# Patient Record
Sex: Female | Born: 1950 | Hispanic: No | Smoking: Never smoker
Health system: Southern US, Community
[De-identification: ages and names within clinical notes are randomized; demographics above are authoritative.]

## PROBLEM LIST (undated history)

## (undated) DIAGNOSIS — E785 Hyperlipidemia, unspecified: Secondary | ICD-10-CM

## (undated) DIAGNOSIS — M069 Rheumatoid arthritis, unspecified: Secondary | ICD-10-CM

## (undated) DIAGNOSIS — M810 Age-related osteoporosis without current pathological fracture: Secondary | ICD-10-CM

## (undated) DIAGNOSIS — Z9889 Other specified postprocedural states: Secondary | ICD-10-CM

## (undated) DIAGNOSIS — R112 Nausea with vomiting, unspecified: Secondary | ICD-10-CM

## (undated) HISTORY — PX: CHOLECYSTECTOMY: SHX55

## (undated) HISTORY — DX: Hyperlipidemia, unspecified: E78.5

## (undated) HISTORY — PX: BUNIONECTOMY: SHX129

## (undated) HISTORY — PX: HYSTEROTOMY: SHX1776

## (undated) HISTORY — DX: Rheumatoid arthritis, unspecified: M06.9

## (undated) HISTORY — PX: BACK SURGERY: SHX140

## (undated) HISTORY — PX: FACIAL COSMETIC SURGERY: SHX629

---

## 2015-06-22 DIAGNOSIS — M542 Cervicalgia: Secondary | ICD-10-CM | POA: Insufficient documentation

## 2015-06-22 DIAGNOSIS — M25561 Pain in right knee: Secondary | ICD-10-CM | POA: Insufficient documentation

## 2015-06-22 DIAGNOSIS — M25562 Pain in left knee: Secondary | ICD-10-CM

## 2015-06-22 DIAGNOSIS — M549 Dorsalgia, unspecified: Secondary | ICD-10-CM | POA: Insufficient documentation

## 2015-06-22 DIAGNOSIS — R768 Other specified abnormal immunological findings in serum: Secondary | ICD-10-CM | POA: Insufficient documentation

## 2015-06-24 DIAGNOSIS — R768 Other specified abnormal immunological findings in serum: Secondary | ICD-10-CM | POA: Insufficient documentation

## 2015-06-30 DIAGNOSIS — Z1239 Encounter for other screening for malignant neoplasm of breast: Secondary | ICD-10-CM | POA: Insufficient documentation

## 2015-06-30 DIAGNOSIS — Z13820 Encounter for screening for osteoporosis: Secondary | ICD-10-CM | POA: Insufficient documentation

## 2015-09-06 DIAGNOSIS — M50323 Other cervical disc degeneration at C6-C7 level: Secondary | ICD-10-CM | POA: Insufficient documentation

## 2015-09-06 DIAGNOSIS — M21612 Bunion of left foot: Secondary | ICD-10-CM | POA: Insufficient documentation

## 2015-09-13 DIAGNOSIS — M2012 Hallux valgus (acquired), left foot: Secondary | ICD-10-CM | POA: Insufficient documentation

## 2015-09-25 DIAGNOSIS — M21619 Bunion of unspecified foot: Secondary | ICD-10-CM | POA: Insufficient documentation

## 2015-09-25 DIAGNOSIS — M810 Age-related osteoporosis without current pathological fracture: Secondary | ICD-10-CM | POA: Insufficient documentation

## 2015-10-04 ENCOUNTER — Ambulatory Visit (INDEPENDENT_AMBULATORY_CARE_PROVIDER_SITE_OTHER): Payer: Medicaid Other

## 2015-10-04 ENCOUNTER — Ambulatory Visit (INDEPENDENT_AMBULATORY_CARE_PROVIDER_SITE_OTHER): Payer: Medicaid Other | Admitting: Podiatry

## 2015-10-04 VITALS — BP 103/58 | HR 65 | Resp 16 | Ht 62.0 in | Wt 128.0 lb

## 2015-10-04 DIAGNOSIS — M2010 Hallux valgus (acquired), unspecified foot: Secondary | ICD-10-CM

## 2015-10-04 DIAGNOSIS — M25569 Pain in unspecified knee: Secondary | ICD-10-CM | POA: Insufficient documentation

## 2015-10-04 DIAGNOSIS — M542 Cervicalgia: Secondary | ICD-10-CM | POA: Insufficient documentation

## 2015-10-04 NOTE — Progress Notes (Signed)
   Subjective:    Patient ID: Heather Mills, female    DOB: 01-01-1951, 65 y.o.   MRN: [id number]  HPI: She presents today with her daughter as an interpreter as she is [place] and speech very little [place]. She states that she has bilateral great toe joint pain this in worsening over the past year she states that the pain is so severe prevent her from sleeping. She states that hurts his regular shoe gear and is limiting her ability to do the things that she likes to do.    Review of Systems  All other systems reviewed and are negative.      Objective:   Physical Exam: Vital signs are stable she is alert and oriented 3 very pleasant family in no acute distress. Pulses are strongly palpable. Neurologic sensorium is intact. Deep tendon reflexes are intact. Muscle strength +5 over 5 dorsiflexion plantar flexors and inverters everters all into the musculature is intact. Orthopedic evaluation demonstrates moderate to severe loss abductovalgus deformity bilateral left greater than right with a very large protuberance. Radiographs taken today do demonstrate near complete dislocation of the first metatarsophalangeal joint left foot as opposed to the right foot. There is no hyper mobility in the first metatarsal medial cuneiform joint. This appears to be tracked down. Cutaneous evaluation demonstrates erythema and edema overlying the first metatarsophalangeal joints bilaterally. Other than this or no open lesions or wounds.        Assessment & Plan:  Severe hallux abductovalgus deformities bilateral. Left greater than right.  Plan: Discussed etiology pathology conservative versus surgical therapies. At this point we consented her today for a [facility] bunion repair with screw fixation left foot. I answered all the questions regarding these procedures to the best of my ability in layman's terms and they were interpreted by the daughter to her mother. We did discuss the possible postop complications which  may include but are not limited to postop pain bleeding swelling infection recurrence need for further surgery overcorrection under correction continued pain chronic pain loss of digit loss of limb loss of life. She signed all 3 patient of the consent form and I will follow-up with her in the near future for surgery.

## 2015-10-04 NOTE — Patient Instructions (Signed)
Pre-Operative Instructions  Congratulations, you have decided to take an important step to improving your quality of life.  You can be assured that the doctors of [facility] will be with you every step of the way.  1. Plan to be at the surgery center/hospital at least 1 (one) hour prior to your scheduled time unless otherwise directed by the surgical center/hospital staff.  You must have a responsible adult accompany you, remain during the surgery and drive you home.  Make sure you have directions to the surgical center/hospital and know how to get there on time. 2. For hospital based surgery you will need to obtain a history and physical form from your family physician within 1 month prior to the date of surgery- we will give you a form for you primary physician.  3. We make every effort to accommodate the date you request for surgery.  There are however, times where surgery dates or times have to be moved.  We will contact you as soon as possible if a change in schedule is required.   4. No Aspirin/Ibuprofen for one week before surgery.  If you are on aspirin, any non-steroidal anti-inflammatory medications (Mobic, Aleve, Ibuprofen) you should stop taking it 7 days prior to your surgery.  You make take Tylenol  For pain prior to surgery.  5. Medications- If you are taking daily heart and blood pressure medications, seizure, reflux, allergy, asthma, anxiety, pain or diabetes medications, make sure the surgery center/hospital is aware before the day of surgery so they may notify you which medications to take or avoid the day of surgery. 6. No food or drink after midnight the night before surgery unless directed otherwise by surgical center/hospital staff. 7. No alcoholic beverages 24 hours prior to surgery.  No smoking 24 hours prior to or 24 hours after surgery. 8. Wear loose pants or shorts- loose enough to fit over bandages, boots, and casts. 9. No slip on shoes, sneakers are best. 10. Bring  your boot with you to the surgery center/hospital.  Also bring crutches or a walker if your physician has prescribed it for you.  If you do not have this equipment, it will be provided for you after surgery. 11. If you have not been contracted by the surgery center/hospital by the day before your surgery, call to confirm the date and time of your surgery. 12. Leave-time from work may vary depending on the type of surgery you have.  Appropriate arrangements should be made prior to surgery with your employer. 13. Prescriptions will be provided immediately following surgery by your doctor.  Have these filled as soon as possible after surgery and take the medication as directed. 14. Remove nail polish on the operative foot. 15. Wash the night before surgery.  The night before surgery wash the foot and leg well with the antibacterial soap provided and water paying special attention to beneath the toenails and in between the toes.  Rinse thoroughly with water and dry well with a towel.  Perform this wash unless told not to do so by your physician.  Enclosed: 1 Ice pack (please put in freezer the night before surgery)   1 Hibiclens skin cleaner   Pre-op Instructions  If you have any questions regarding the instructions, do not hesitate to call our office.  [place]: [address] [place], [place] [address] [phone]  [place]: [address] [place], [place] [address] [phone]  [place]: [address]  [place], [place] [address] [phone]   Dr.   Norman Regal DPM, Dr. Matthew Wagoner DPM, Dr. M. Todd Victormanuel Mclure DPM, Dr. Titorya Stover DPM 

## 2015-10-18 DIAGNOSIS — J392 Other diseases of pharynx: Secondary | ICD-10-CM | POA: Insufficient documentation

## 2015-11-01 ENCOUNTER — Other Ambulatory Visit: Payer: Self-pay

## 2015-11-10 ENCOUNTER — Other Ambulatory Visit: Payer: Self-pay | Admitting: Podiatry

## 2015-11-10 MED ORDER — CEPHALEXIN 500 MG PO CAPS: 500.0000 mg | ORAL_CAPSULE | Freq: Three times a day (TID) | ORAL | 0 refills | Status: DC

## 2015-11-10 MED ORDER — PROMETHAZINE HCL 25 MG PO TABS: 25.0000 mg | ORAL_TABLET | Freq: Three times a day (TID) | ORAL | 0 refills | Status: DC | PRN

## 2015-11-10 MED ORDER — OXYCODONE-ACETAMINOPHEN 10-325 MG PO TABS: 1.0000 | ORAL_TABLET | Freq: Four times a day (QID) | ORAL | 0 refills | Status: DC | PRN

## 2015-11-11 ENCOUNTER — Encounter: Payer: Self-pay | Admitting: Podiatry

## 2015-11-11 DIAGNOSIS — M2012 Hallux valgus (acquired), left foot: Secondary | ICD-10-CM | POA: Diagnosis not present

## 2015-11-14 ENCOUNTER — Telehealth: Payer: Self-pay | Admitting: *Deleted

## 2015-11-14 NOTE — Telephone Encounter (Signed)
May dc percocet.  May start dilaudid 2 mg  One to two every six. If promethazine is not working then zofran.  If symptoms worsen go to ED.  She should not have an excessive amount of pain.

## 2015-11-14 NOTE — Telephone Encounter (Addendum)
Heather Mills, pt's dtr states pt has nausea and vomiting, with rash to her stomach after taking the pain medication.  I told Heather Mills that pt is to stop the pain medication percocet, begin phenergan for the nausea and Benadryl for the itching. I told her I would contact Dr. Al Corpus and call again with instruction. 11/15/2015-Left message with Dr. Geryl Rankins orders and instructions to take with food and 30 minutes prior to dilaudid. 12/02/2015-Heather Mills, pt's dtr states pt is uncomfortable in the compression hose given by Dr. Al Corpus yesterday, even had a change of color in the little toe. I told Heather Mills to not to allow pt to sleep in the compression anklet, to begin 1st in the morning before pt dangles foot over the bed, and to adjust compression for comfort is rubs area, that if during the day it becomes uncomfortable to remove and wrap from the toes up the leg with the ace wrap and try again the next day, that we are trying to train the post op swelling to remain out of the foot. Heather Mills states understanding.

## 2015-11-15 MED ORDER — HYDROMORPHONE HCL 2 MG PO TABS: ORAL_TABLET | ORAL | 0 refills | Status: DC

## 2015-11-17 ENCOUNTER — Ambulatory Visit (INDEPENDENT_AMBULATORY_CARE_PROVIDER_SITE_OTHER): Payer: Medicaid Other | Admitting: Podiatry

## 2015-11-17 ENCOUNTER — Ambulatory Visit (INDEPENDENT_AMBULATORY_CARE_PROVIDER_SITE_OTHER): Payer: Medicaid Other

## 2015-11-17 VITALS — BP 116/64 | Temp 97.1°F

## 2015-11-17 DIAGNOSIS — Z9889 Other specified postprocedural states: Secondary | ICD-10-CM

## 2015-11-17 DIAGNOSIS — M2010 Hallux valgus (acquired), unspecified foot: Secondary | ICD-10-CM | POA: Diagnosis not present

## 2015-11-17 NOTE — Progress Notes (Signed)
She presents today for her first postop visit [facility] bunion repair date of surgery 11/11/2015 left foot. She denies fever chills nausea vomiting muscle aches and pains.  Objective: Dry sterile dressing intact was removed demonstrates mild edema no erythema saline as drainage or odor. She has no pain on range of motion of the foot. Toe is rectus radiographs demonstrate good position of the capital osteotomy.  Assessment: Healing surgical foot left.  Plan: Redressed today drastic compressive dressing continue the use of the Cam Walker follow-up with [place] in 1 week.

## 2015-11-17 NOTE — Progress Notes (Signed)
DOS 10.13.2017 [facility] Bunion Repair with Screw Left Foot

## 2015-11-24 ENCOUNTER — Encounter: Payer: Self-pay | Admitting: Podiatry

## 2015-11-24 ENCOUNTER — Other Ambulatory Visit: Payer: Self-pay | Admitting: Podiatry

## 2015-11-24 ENCOUNTER — Ambulatory Visit (INDEPENDENT_AMBULATORY_CARE_PROVIDER_SITE_OTHER): Payer: Medicaid Other | Admitting: Podiatry

## 2015-11-24 DIAGNOSIS — M2010 Hallux valgus (acquired), unspecified foot: Secondary | ICD-10-CM

## 2015-11-24 DIAGNOSIS — Z9889 Other specified postprocedural states: Secondary | ICD-10-CM

## 2015-11-24 MED ORDER — TRAMADOL HCL 50 MG PO TABS: 50.0000 mg | ORAL_TABLET | Freq: Four times a day (QID) | ORAL | 0 refills | Status: DC | PRN

## 2015-11-24 NOTE — Telephone Encounter (Signed)
Request for order of Cephalexin.

## 2015-11-26 NOTE — Progress Notes (Signed)
She presents today 2 weeks status post Heather Mills bunionectomy of her left foot. She states that it has been sore recently since she's been up walking around more frequently. Date of surgery 11/11/2015.  Objective: Vital signs are stable alert and oriented 3. Pulses are palpable. Neurologic sensorium is intact. Moderate edema left. Good range of motion first metatarsophalangeal joint but tender.  Assessment: Well-healing surgical foot 2-3 weeks out.  Plan: She's going increase range of motion place her in a Darco shoe andfollow up with her in 1-2 weeks.

## 2015-12-01 ENCOUNTER — Ambulatory Visit (INDEPENDENT_AMBULATORY_CARE_PROVIDER_SITE_OTHER): Payer: Medicaid Other

## 2015-12-01 ENCOUNTER — Ambulatory Visit (INDEPENDENT_AMBULATORY_CARE_PROVIDER_SITE_OTHER): Payer: Medicaid Other | Admitting: Podiatry

## 2015-12-01 DIAGNOSIS — M2010 Hallux valgus (acquired), unspecified foot: Secondary | ICD-10-CM

## 2015-12-01 DIAGNOSIS — Z9889 Other specified postprocedural states: Secondary | ICD-10-CM

## 2015-12-01 NOTE — Progress Notes (Signed)
She presents today 3 weeks status post [facility] bunion repair left foot. She states that she is doing a lot better and feels much better than she did last week. Denies fever chills nausea vomiting muscle aches and pains. States that the left foot feels much better.  Objective: Vital signs are stable she is alert and oriented 3 mild edema. Good range of motion of the first metatarsophalangeal joint. Radiographs taken today do not demonstrate any type of major osseous abnormalities there is no been no movement of the capital fragment. Internal fixation remains intact.  Assessment: Well-healing surgical foot left.  Plan: Place her in a compression anklet today encouraged range of motion exercises will follow up with her in approximately 2 weeks which time we'll get back into regular shoe gear. X-rays will be taken at that time.

## 2015-12-09 DIAGNOSIS — E785 Hyperlipidemia, unspecified: Secondary | ICD-10-CM | POA: Insufficient documentation

## 2015-12-09 DIAGNOSIS — Z23 Encounter for immunization: Secondary | ICD-10-CM | POA: Insufficient documentation

## 2015-12-09 DIAGNOSIS — E7800 Pure hypercholesterolemia, unspecified: Secondary | ICD-10-CM | POA: Insufficient documentation

## 2015-12-13 ENCOUNTER — Ambulatory Visit (INDEPENDENT_AMBULATORY_CARE_PROVIDER_SITE_OTHER): Payer: Medicaid Other

## 2015-12-13 ENCOUNTER — Ambulatory Visit (INDEPENDENT_AMBULATORY_CARE_PROVIDER_SITE_OTHER): Payer: Medicaid Other | Admitting: Podiatry

## 2015-12-13 DIAGNOSIS — Z9889 Other specified postprocedural states: Secondary | ICD-10-CM

## 2015-12-13 DIAGNOSIS — M2010 Hallux valgus (acquired), unspecified foot: Secondary | ICD-10-CM

## 2015-12-13 NOTE — Progress Notes (Signed)
She presents today for follow-up of her surgical foot left she is status post [facility] bunion repair 11/11/2015. 1 month out and states that she cannot wear the compression anklet because it is too tight of her foot. She would also like to consider having the other foot corrected.  Objective: Vital signs are stable she is alert and oriented 3. Pulses are palpable. Left foot appears to be healing very night she has a great range of motion of the first metatarsophalangeal joint limited edema. Radiographs demonstrate mild edema first metatarsal is intact. Internal fixation is intact and in good position well-healing osteotomy.  Assessment: For follow-up with her in 3-4 weeks which time we will discuss the other foot. At this point and would allow her to get back into regular tennis shoes or the Darco shoe.

## 2015-12-15 DIAGNOSIS — M0579 Rheumatoid arthritis with rheumatoid factor of multiple sites without organ or systems involvement: Secondary | ICD-10-CM | POA: Insufficient documentation

## 2015-12-27 ENCOUNTER — Ambulatory Visit (INDEPENDENT_AMBULATORY_CARE_PROVIDER_SITE_OTHER): Payer: Medicaid Other

## 2015-12-27 ENCOUNTER — Ambulatory Visit (INDEPENDENT_AMBULATORY_CARE_PROVIDER_SITE_OTHER): Payer: Medicaid Other | Admitting: Podiatry

## 2015-12-27 DIAGNOSIS — Z9889 Other specified postprocedural states: Secondary | ICD-10-CM

## 2015-12-27 DIAGNOSIS — M2010 Hallux valgus (acquired), unspecified foot: Secondary | ICD-10-CM | POA: Diagnosis not present

## 2015-12-28 NOTE — Progress Notes (Signed)
She presents today for a 6 week postop visit regarding her [facility] bunion repair of her left foot. She states is doing much better but continues to wear her Darco shoe because of the swelling.  Objective: Vital signs are stable she is alert and oriented 3. Moderate edema limited range of motion dorsiflexion and plantarflexion radiographs demonstrate well-healing osteotomy. No fractures or are identified. I also reviewed all radiographs of the right foot which they are requesting surgical intervention upon.  Right foot does demonstrate a increase in the first metatarsal angle with a hypertrophic medial condyle.  Assessment: Well-healing surgical foot left. Some noncompliance resulting in the edema and lack of range of motion. Right foot hallux valgus deformity painful in nature.  Plan: Consented her today for [facility] bunion repair of the right foot. Encouraged to utilize the tennis shoes more frequently than the Darco shoe to help increase range of motion and get her back to walking on the left foot. She went over the consent form today line by line number by number giving her appetite as much she sought the regarding that right foot surgery. We answered all his questions to the best of my ability in layman's terms. She will follow up with [place] in the near future for surgical intervention or in 3 weeks for follow-up of the left foot.

## 2016-01-17 ENCOUNTER — Ambulatory Visit (INDEPENDENT_AMBULATORY_CARE_PROVIDER_SITE_OTHER): Payer: Medicaid Other

## 2016-01-17 ENCOUNTER — Ambulatory Visit (INDEPENDENT_AMBULATORY_CARE_PROVIDER_SITE_OTHER): Payer: Self-pay | Admitting: Podiatry

## 2016-01-17 DIAGNOSIS — M2010 Hallux valgus (acquired), unspecified foot: Secondary | ICD-10-CM | POA: Diagnosis not present

## 2016-01-17 NOTE — Progress Notes (Signed)
She presents today for follow-up of her bunion repair left foot. She states this seems to be getting better.  Objective: Vital signs are stable she is alert and oriented 3. Pulses are palpable. Neurologic sensorium is intact. She has great range of motion of the first metatarsophalangeal joint of the left foot with mild tenderness on range of motion. Radiographs confirm well-healing osteotomy.  Assessment: Hallux abductovalgus for me right foot. Healing first metatarsal osteotomy left foot.  Plan: Encouraged range of motion exercises and ambulation for the left foot. We will review the consent form once again for the right foot today confirming [facility] bunion repair and we'll follow-up with her in the near future. January 5 for the right foot.

## 2016-02-02 ENCOUNTER — Other Ambulatory Visit: Payer: Self-pay | Admitting: Podiatry

## 2016-02-02 MED ORDER — HYDROMORPHONE HCL 2 MG PO TABS: ORAL_TABLET | ORAL | 0 refills | Status: DC

## 2016-02-02 MED ORDER — PROMETHAZINE HCL 25 MG PO TABS: 25.0000 mg | ORAL_TABLET | Freq: Three times a day (TID) | ORAL | 0 refills | Status: DC | PRN

## 2016-02-03 ENCOUNTER — Encounter: Payer: Self-pay | Admitting: Podiatry

## 2016-02-03 DIAGNOSIS — M2011 Hallux valgus (acquired), right foot: Secondary | ICD-10-CM | POA: Diagnosis not present

## 2016-02-06 ENCOUNTER — Telehealth: Payer: Self-pay

## 2016-02-06 NOTE — Telephone Encounter (Signed)
LVM regarding post operative status. Call with any questions or concerns

## 2016-02-09 ENCOUNTER — Encounter: Payer: Self-pay | Admitting: Podiatry

## 2016-02-09 ENCOUNTER — Ambulatory Visit (INDEPENDENT_AMBULATORY_CARE_PROVIDER_SITE_OTHER): Payer: Medicaid Other

## 2016-02-09 ENCOUNTER — Ambulatory Visit (INDEPENDENT_AMBULATORY_CARE_PROVIDER_SITE_OTHER): Payer: Self-pay | Admitting: Podiatry

## 2016-02-09 VITALS — Temp 98.7°F

## 2016-02-09 DIAGNOSIS — M2010 Hallux valgus (acquired), unspecified foot: Secondary | ICD-10-CM

## 2016-02-09 NOTE — Progress Notes (Signed)
She presents today for a follow-up of her [facility] bunionectomy right foot. Status post 1 week now date of surgery 02/03/2016.  Objective: Vital signs are stable she is alert and oriented 3 dry sterile dressing intact demonstrates was removed no erythema cellulitis drainage or odor. Just mild edema. She has good range of motion of the first metatarsophalangeal joint.  Assessment: Well-healing surgical foot right.  Plan: Follow up with me in 1 week.

## 2016-02-17 ENCOUNTER — Ambulatory Visit (INDEPENDENT_AMBULATORY_CARE_PROVIDER_SITE_OTHER): Payer: Self-pay | Admitting: Podiatry

## 2016-02-17 ENCOUNTER — Ambulatory Visit (INDEPENDENT_AMBULATORY_CARE_PROVIDER_SITE_OTHER): Payer: Medicaid Other

## 2016-02-17 DIAGNOSIS — M2010 Hallux valgus (acquired), unspecified foot: Secondary | ICD-10-CM

## 2016-02-19 NOTE — Progress Notes (Signed)
Subjective:     Patient ID: Heather Mills, female   DOB: 10/16/1950, 66 y.o.   MRN: [id number]  HPI patient presents with caregiver stating that her right foot seems to be feeling okay and she's here for stitch removal and that she's had some discomfort on the side of her left big toe she wanted checked. Presents with caregiver   Review of Systems     Objective:   Physical Exam Neurovascular status intact negative Homan sign was noted with wound edges well coapted first MPJ right and digit. Good alignment is noted good range of motion with no crepitus and on the left foot I noted mild discomfort in the lateral side of the first MPJ where she had previous surgery several months ago    Assessment:     Doing well right foot with mild to moderate inflammatory condition left which may be related to increased activity or excessive weightbearing    Plan:     H&P conditions reviewed precautionary x-ray taken left. Stitches removed right sterile dressing reapplied and reappoint for reevaluation with continued immobilization at this time  X-ray report left was negative for signs of bone movement or movement of fixation.

## 2016-02-23 ENCOUNTER — Encounter: Payer: Self-pay | Admitting: Podiatry

## 2016-02-23 ENCOUNTER — Ambulatory Visit (INDEPENDENT_AMBULATORY_CARE_PROVIDER_SITE_OTHER): Payer: Medicaid Other

## 2016-02-23 ENCOUNTER — Ambulatory Visit (INDEPENDENT_AMBULATORY_CARE_PROVIDER_SITE_OTHER): Payer: Self-pay | Admitting: Podiatry

## 2016-02-23 DIAGNOSIS — M2010 Hallux valgus (acquired), unspecified foot: Secondary | ICD-10-CM

## 2016-02-23 DIAGNOSIS — Z9889 Other specified postprocedural states: Secondary | ICD-10-CM

## 2016-02-23 NOTE — Progress Notes (Signed)
She presents today for follow-up of her bilateral foot surgery. The first foot surgery was performed in 11/11/2015 was an [facility] bunionectomy left foot. The second one 02/03/2016 with a [facility] bunionectomy right foot. She states that the left foot has become very tender and very sore as well as pain has started to develop to the plantar lateral aspect as well. The right foot is starting to become tender and painful as well.  Objective: Vital signs are stable she is alert and oriented 3 there is no erythema edema cellulitis drainage or odor to the right foot she has good range of motion of the first metatarsophalangeal joint that she states that she has some tenderness. Left foot does demonstrate is cool to the touch the pulses are palpable. 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 At this point physical therapy is to be her best chance.  Assessment: Surgical foot bunionectomy bilateral. Postoperative pain worsening.  Plan: Send her to physical therapy for range of motion strengthening and evaluation. Follow up with her in 3-4 weeks after this if she is not better consider chronic pain facility. My also consider topical.

## 2016-02-28 ENCOUNTER — Encounter: Payer: Self-pay | Admitting: Podiatry

## 2016-02-28 ENCOUNTER — Telehealth: Payer: Self-pay | Admitting: *Deleted

## 2016-02-28 DIAGNOSIS — Z9889 Other specified postprocedural states: Secondary | ICD-10-CM

## 2016-02-28 DIAGNOSIS — R0989 Other specified symptoms and signs involving the circulatory and respiratory systems: Secondary | ICD-10-CM

## 2016-02-28 DIAGNOSIS — M2011 Hallux valgus (acquired), right foot: Secondary | ICD-10-CM

## 2016-02-28 NOTE — Telephone Encounter (Addendum)
Heather Mills states they do not take Medicaid. 02/28/2016-Referral to [facility] faxed. 03/15/2016-Left message for pt to call with new Medicaid information or fax copy of card to [phone]. 03/30/2016-Left message requesting up date of pt's Medicaid card, so I could pre-cert pt's circulation test.

## 2016-02-29 ENCOUNTER — Telehealth: Payer: Self-pay | Admitting: *Deleted

## 2016-02-29 NOTE — Telephone Encounter (Signed)
Called and left a message at 5:04 for the patient to call the office back and we would answer any questions. [name]

## 2016-03-01 ENCOUNTER — Ambulatory Visit: Payer: Medicare Other | Attending: Podiatry

## 2016-03-01 DIAGNOSIS — M25571 Pain in right ankle and joints of right foot: Secondary | ICD-10-CM | POA: Diagnosis not present

## 2016-03-01 DIAGNOSIS — M6281 Muscle weakness (generalized): Secondary | ICD-10-CM

## 2016-03-01 DIAGNOSIS — R2689 Other abnormalities of gait and mobility: Secondary | ICD-10-CM | POA: Diagnosis present

## 2016-03-01 DIAGNOSIS — M25674 Stiffness of right foot, not elsewhere classified: Secondary | ICD-10-CM | POA: Diagnosis present

## 2016-03-01 DIAGNOSIS — R600 Localized edema: Secondary | ICD-10-CM | POA: Insufficient documentation

## 2016-03-01 NOTE — Therapy (Addendum)
[facility] [address] [place], [place], [address] Phone: [phone]   Fax:  [phone]  Physical Therapy Evaluation  Patient Details  Name: Heather Mills MRN: [id number] Date of Birth: 02/25/1950 Referring Provider: Tyson Dense, MD  Encounter Date: 03/01/2016      PT End of Session - 03/01/16 1156    Visit Number 1   Date for PT Re-Evaluation 04/26/16   PT Start Time 1105   PT Stop Time 1148  no treatment due to Medicaid.     PT Time Calculation (min) 43 min   Activity Tolerance Patient tolerated treatment well   Behavior During Therapy [facility] for tasks assessed/performed      History reviewed. No pertinent past medical history.  History reviewed. No pertinent surgical history.  There were no vitals filed for this visit.       Subjective Assessment - 03/01/16 1113    Subjective Pt reports to PT s/p Rt McBride bunionectomy surgery performed 02/03/16.  Pt had Austin Bunionectomy to the Lt foot 11/11/15.  Pt is having more problems in the Lt foot.  Pt is outside the 60 day window for the Lt foot s/p surgery.  PT will evaluate and treat for Rt foot and provide treatment for Rt foot.  Pt has pain and stiffness in both feet at this time.     Patient is accompained by: Family member;Interpreter   How long can you stand comfortably? 10 minutes-Rt and Lt foot pain   How long can you walk comfortably? 10 minutes- Rt and Lt foot pain   Patient Stated Goals reduce foot pain, improve gait, improve flexibility and strength   Currently in Pain? Yes   Pain Score 7   Lt foot 8-9/10   Pain Location Foot   Pain Orientation Right;Left   Pain Descriptors / Indicators Burning;Sharp;Sore   Pain Type Surgical pain   Pain Onset 1 to 4 weeks ago   Pain Frequency Constant   Aggravating Factors  standing and walking, moving the foot, any postion   Pain Relieving Factors pain medication, sleep            [facility] PT Assessment -  03/01/16 0001      Assessment   Medical Diagnosis hallux abducto valgus s/p McBride bunionectomy Lt foot   Referring Provider Tyson Dense, MD   Onset Date/Surgical Date 02/03/16   Next MD Visit 3 weeks   Prior Therapy none     Precautions   Precautions Other (comment)  post surgical shoe on the Rt     Restrictions   Weight Bearing Restrictions No     Balance Screen   Has the patient fallen in the past 6 months No   Has the patient had a decrease in activity level because of a fear of falling?  No   Is the patient reluctant to leave their home because of a fear of falling?  No     Home Environment   Living Environment Private residence   Type of [place] Access Level entry     Prior Function   Level of Independence Independent   Vocation Unemployed   Leisure exercise, walking     Cognition   Overall Cognitive Status Within Functional Limits for tasks assessed     Observation/Other Assessments   Focus on Therapeutic Outcomes (FOTO)  69% limitation     Posture/Postural Control   Posture/Postural Control No significant limitations     ROM /  Strength   AROM / PROM / Strength AROM;PROM;Strength     AROM   Overall AROM  Deficits   Overall AROM Comments Rt great toe extension 23, flexion 12.  Lt great toe extension 20, flexion 15   AROM Assessment Site Ankle   Right/Left Ankle Right;Left   Right Ankle Dorsiflexion 20   Right Ankle Inversion --  limited by 25%   Right Ankle Eversion --  limited by 25%   Left Ankle Dorsiflexion 20   Left Ankle Inversion --  limited by 25%   Left Ankle Eversion --  Limited by 25%     PROM   Overall PROM  Deficits   Overall PROM Comments decreased Lt great toe mobility with mobs due to fixation.  Rt great toe with limited joint mobility.     Strength   Overall Strength Deficits   Overall Strength Comments Rt and Lt knee 4+/5 bil., hip flexion 4+/5   Strength Assessment Site Ankle   Right/Left Ankle Left;Right   Right  Ankle Dorsiflexion 4-/5   Right Ankle Inversion 4-/5   Right Ankle Eversion 4-/5   Left Ankle Dorsiflexion 4-/5   Left Ankle Inversion 4-/5   Left Ankle Eversion 4-/5     Palpation   Palpation comment Great toe incision on Lt 2" well healed.  Lt foot cold to touch with discoloration (mild) at the ball of the foot.  Rt great toe incision 2" healing.  Warmth and mild edema over the Rt dorsum of foot.       Transfers   Transfers Independent with all Transfers     Ambulation/Gait   Ambulation/Gait Yes   Ambulation/Gait Assistance 6: Modified independent (Device/Increase time)   Gait Pattern Step-through pattern;Decreased stance time - right;Decreased stride length  wearing post surgical shoe on the Rt                   G-codes: Mobility category CL all 3 categories          PT Short Term Goals - 03/01/16 1204      PT SHORT TERM GOAL #1   Title be independent in initial HEP   Time 4   Period Weeks   Status New           PT Long Term Goals - 03/01/16 1105      PT LONG TERM GOAL #1   Title be independent in advanced HEP   Time 8   Period Weeks   Status New     PT LONG TERM GOAL #2   Title reduce FOTO to < or = to 50% limitation   Time 8   Period Weeks   Status New     PT LONG TERM GOAL #3   Title report < or = to 3-4/10 Rt foot pain with standing   Baseline 7/10   Time 8   Period Weeks   Status New     PT LONG TERM GOAL #4   Title demonstrate 4+/5 Rt ankle and great toe strength to improve endurance and safety in the community   Baseline 4-/5   Time 8   Period Weeks   Status New     PT LONG TERM GOAL #5   Title tolerate standing for > or = to 30 minutes without limitation to improve independence in the community   Baseline 10 minutes max   Time 8   Period Weeks   Status New  Plan - 03/01/16 1157    Clinical Impression Statement Pt presents to PT s/p Rt McBride bunionectomy surgery performed on 02/03/16.  Pt also  had Austin Bunionectomy on the Lt foot on 11/11/15.  Pt with more reported problems with the Lt foot s/p surgery and this is outside the 60 day window for treatment per pt's insurance.  Pt with healing surcial incision on the Rt, limited bil. ankle strength and bil. great toe A/ROM and strength.  Pt is limited to walking 10 minutes due to pain and weakness.  Pt reports 7-9/10 Rt and Lt foot pain that is constant.  Pt with antalgic gait on level surface.  Pt is a low complexity evaluation due to lack of comorbidities that impact care.  Pt will benefit from skilled PT for Rt foot and ankle strength and flexiblity, scar mobilization, edema management and pain management to improve gait and endurance for home and community tasks.     Rehab Potential Good   PT Frequency 1x / week   PT Duration 8 weeks   PT Treatment/Interventions ADLs/Self Care Home Management;Cryotherapy;Electrical Stimulation;Functional mobility training;Stair training;Gait training;Ultrasound;Moist Heat;Therapeutic activities;Therapeutic exercise;Neuromuscular re-education;Patient/family education;Passive range of motion;Manual techniques;Vasopneumatic Device;Taping   PT Next Visit Plan Rt great toe mobility (gentle), intrinic strength, edema management, gait.  Ankle strength in non-weightbearing   Consulted and Agree with Plan of Care Patient      Patient will benefit from skilled therapeutic intervention in order to improve the following deficits and impairments:  Increased edema, Decreased strength, Decreased mobility, Decreased scar mobility, Impaired flexibility, Hypomobility, Pain, Decreased activity tolerance, Decreased endurance, Difficulty walking, Decreased range of motion  Visit Diagnosis: Pain in right ankle and joints of right foot - Plan: PT plan of care cert/re-cert  Localized edema - Plan: PT plan of care cert/re-cert  Other abnormalities of gait and mobility - Plan: PT plan of care cert/re-cert  Muscle weakness  (generalized) - Plan: PT plan of care cert/re-cert  Stiffness of right foot, not elsewhere classified - Plan: PT plan of care cert/re-cert     Problem List Patient Active Problem List   Diagnosis Date Noted  . Knee pain 10/04/2015  . Neck pain on right side 10/04/2015  . Bunion of unspecified foot 09/25/2015  . Osteoporosis 09/25/2015  . Hallux valgus of left foot 09/13/2015  . Bunion, left foot 09/06/2015  . Other cervical disc degeneration at C6-C7 level 09/06/2015  . Screening for breast cancer 06/30/2015  . Screening for osteoporosis 06/30/2015  . Positive ANA (antinuclear antibody) 06/24/2015  . Arthralgia of both knees 06/22/2015  . Elevated rheumatoid factor 06/22/2015  . Neck pain on left side 06/22/2015  . Upper back pain on left side 06/22/2015    Sigurd Sos, PT 03/01/16 12:16 PM PHYSICAL THERAPY DISCHARGE SUMMARY  Visits from Start of Care: 1  Current functional level related to goals / functional outcomes: Pt attended evaluation only and didn't return due to insurance.     Remaining deficits: See above for most current status.     Education / Equipment: HEP Plan: Patient agrees to discharge.  Patient goals were not met. Patient is being discharged due to financial reasons.  ?????         Sigurd Sos, PT 04/03/16 8:22 AM  [facility] [address], [place], [place], [address] Phone: [phone]   Fax:  [phone]  Name: Teka Chanda MRN: [id number] Date of Birth: 11/10/1950

## 2016-03-02 ENCOUNTER — Telehealth: Payer: Self-pay

## 2016-03-02 NOTE — Telephone Encounter (Signed)
Spoke with patient's daughter who states that her mom has had an increase in pain in her Rt foot DOS 11/10/15. She states that the pain has increased, and swelling has increased, along with slight discoloration at incision site. She calf pain and tenderness. Denied fever chills or nausea, no chest pain or SHOB. Advised on epsom salt soaks in warm water to help relieve pain. Also advised to start taking prescribed tramadol as directed but to stop taking meloxicam. She is to make an appt to see Dr Al Corpus next week, or call with any acute symptom changes

## 2016-03-06 ENCOUNTER — Ambulatory Visit (INDEPENDENT_AMBULATORY_CARE_PROVIDER_SITE_OTHER): Payer: Medicaid Other

## 2016-03-06 ENCOUNTER — Ambulatory Visit (INDEPENDENT_AMBULATORY_CARE_PROVIDER_SITE_OTHER): Payer: Self-pay | Admitting: Podiatry

## 2016-03-06 ENCOUNTER — Encounter: Payer: Self-pay | Admitting: Podiatry

## 2016-03-06 VITALS — BP 111/63 | HR 74 | Resp 16

## 2016-03-06 DIAGNOSIS — M2010 Hallux valgus (acquired), unspecified foot: Secondary | ICD-10-CM

## 2016-03-06 DIAGNOSIS — Z9889 Other specified postprocedural states: Secondary | ICD-10-CM

## 2016-03-06 MED ORDER — METHYLPREDNISOLONE 4 MG PO TBPK
ORAL_TABLET | ORAL | 0 refills | Status: DC
Start: 2016-03-06 — End: 2016-07-03

## 2016-03-06 MED ORDER — METHYLPREDNISOLONE 4 MG PO TABS: 4.0000 mg | ORAL_TABLET | Freq: Every day | ORAL | 0 refills | Status: DC

## 2016-03-06 NOTE — Telephone Encounter (Addendum)
-----   Message from Elinor Parkinson, [place] sent at 03/06/2016 11:43 AM EST ----- Can you please send her for vascular consult LE dopplar ABI's [facility]. Faxed referral, clinicals and demographics to [facility] Orders have been printed for ABI and TBIs, being held for pt to call with insurance information and PA with Medicaid.07/03/2016-Faxed referral, clinicals and demographics to [facility].

## 2016-03-06 NOTE — Progress Notes (Signed)
She presents today for follow-up of her surgical feet. She states that she still has pain bilaterally and she states that he really feels the same as it did last time. Date of surgery for McBride bunion ectomy 02/03/2016 for an Heather Mills was 10/12/2015. She still complaining of pain and discoloration to the forefoot and the lack of motion. Her daughter states that she has been trying to increase the range of motion but the pain is severe. She continues to take meloxicam prescribed by another physician on a regular basis. She continues to wear tissue left in a [place] on the right.  Objective: Vital signs are stable alert and oriented 3. Limited range of motion of the first metatarsophalangeal joints bilaterally. She has tenderness across the metatarsophalangeal joints and there is a definite color discrepancy between the 2 feet. Pulses are palpable at the level of the ankle and dorsalis pedis however digital changes must persist because of shiny skin that does not match the other foot. She did not rate want radiographs today though I requested that she declined.  Assessment: Heather Mills bunion repair left 11/11/2015 McBride bunion repair right 02/03/2016 with retained swelling and pain.  Plan: Started her on a Medrol Dosepak and sent for vascular evaluation. She was unable to have physical therapy because of the type of insurance she has.

## 2016-03-06 NOTE — Progress Notes (Signed)
DOS 01.05.2018 McBride bunion repair right foot with possible osteotomy.

## 2016-03-22 ENCOUNTER — Ambulatory Visit: Payer: Medicaid Other | Admitting: Podiatry

## 2016-03-22 DIAGNOSIS — M2010 Hallux valgus (acquired), unspecified foot: Secondary | ICD-10-CM

## 2016-03-22 DIAGNOSIS — M2011 Hallux valgus (acquired), right foot: Secondary | ICD-10-CM

## 2016-03-22 NOTE — Progress Notes (Unsigned)
Pt no showed for post op appt, has not gone for vascular consult since last office visit. Schedulers will contact to follow up in regards to rescheduling appt

## 2016-03-26 NOTE — Progress Notes (Deleted)
Cardiology Office Note    Date:  03/26/2016   ID:  Mills, Heather [birth year], MRN [id number]  PCP:  Deneen Harts, FNP  Cardiologist: New  Chief Complaint: Diminished pulse  History of Present Illness:   Heather Mills is a 66 y.o. female HLD and s/p Rt McBride bunionectomy surgery performed on 02/03/16 and Heather Mills on the Lt foot on 11/11/15 presents for right foot pain.    Past Medical History:  Diagnosis Date  . Dyslipidemia     No past surgical history on file.  Current Medications: Prior to Admission medications   Medication Sig Start Date End Date Taking? Authorizing Provider  atorvastatin (LIPITOR) 10 MG tablet Take 10 mg by mouth.    Historical Provider, MD  calcium acetate (PHOSLO) 667 MG capsule Take 1,334 mg by mouth.    Historical Provider, MD  cephALEXin (KEFLEX) 500 MG capsule TAKE ONE CAPSULE BY MOUTH THREE TIMES DAILY 11/24/15   Max T Hyatt, DPM  DOCOSAHEXAENOIC ACID PO Take by mouth.    Historical Provider, MD  gabapentin (NEURONTIN) 100 MG capsule Take 1 cap hs for 3 days then 2 cap hs for 1 week then 3 cap hs 07/29/15   Historical Provider, MD  gabapentin (NEURONTIN) 300 MG capsule Take 1 cap tid 10/04/15   Historical Provider, MD  Glucosamine HCl (SM GLUCOSAMINE HCL) 1500 MG TABS Take by mouth.    Historical Provider, MD  hydroxychloroquine (PLAQUENIL) 200 MG tablet Take 300 mg by mouth. 09/06/15   Historical Provider, MD  meloxicam (MOBIC) 15 MG tablet Take 15 mg by mouth. 06/30/15 06/29/16  Historical Provider, MD  methylPREDNISolone (MEDROL) 4 MG TBPK tablet Tapering 6 day dose pack 03/06/16   Max T Hyatt, DPM  Multiple Vitamin (MULTIVITAMIN) capsule Take by mouth.    Historical Provider, MD  traMADol (ULTRAM) 50 MG tablet Take 1 tablet (50 mg total) by mouth every 6 (six) hours as needed. 11/24/15   Max Maud Deed, DPM    Allergies:   Percocet [oxycodone-acetaminophen]   Social History   Social History  . Marital status: Divorced    Spouse name:  N/A  . Number of children: N/A  . Years of education: N/A   Social History Main Topics  . Smoking status: Unknown If Ever Smoked  . Smokeless tobacco: Never Used  . Alcohol use Not on file  . Drug use: Unknown  . Sexual activity: Not on file   Other Topics Concern  . Not on file   Social History Narrative  . No narrative on file     Family History:  The patient's family history includes Heart attack in her father; Heart block in her mother. She was adopted. ***  ROS:   Please see the history of present illness.    ROS All other systems reviewed and are negative.   PHYSICAL EXAM:   VS:  There were no vitals taken for this visit.   GEN: Well nourished, well developed, in no acute distress  HEENT: normal  Neck: no JVD, carotid bruits, or masses Cardiac: ***RRR; no murmurs, rubs, or gallops,no edema  Respiratory:  clear to auscultation bilaterally, normal work of breathing GI: soft, nontender, nondistended, + BS MS: no deformity or atrophy  Skin: warm and dry, no rash Neuro:  Alert and Oriented x 3, Strength and sensation are intact Psych: euthymic mood, full affect  Wt Readings from Last 3 Encounters:  10/04/15 128 lb (58.1 kg)      Studies/Labs Reviewed:  EKG:  EKG is ordered today.  The ekg ordered today demonstrates ***  Recent Labs: No results found for requested labs within last 8760 hours.   Lipid Panel No results found for: CHOL, TRIG, HDL, CHOLHDL, VLDL, LDLCALC, LDLDIRECT  Additional studies/ records that were reviewed today include:   Echocardiogram:  Cardiac Catheterization:     ASSESSMENT & PLAN:    1. ***    Medication Adjustments/Labs and Tests Ordered: Current medicines are reviewed at length with the patient today.  Concerns regarding medicines are outlined above.  Medication changes, Labs and Tests ordered today are listed in the Patient Instructions below. There are no Patient Instructions on file for this visit.    Lorelei Pont, [place]  03/26/2016 10:02 PM    [facility] [address] [place], [place], [place]  [address] Phone: [phone]; Fax: [phone]

## 2016-03-27 ENCOUNTER — Ambulatory Visit: Payer: Self-pay | Admitting: Physician Assistant

## 2016-03-29 ENCOUNTER — Encounter: Payer: Self-pay | Admitting: Physician Assistant

## 2016-07-03 ENCOUNTER — Ambulatory Visit (INDEPENDENT_AMBULATORY_CARE_PROVIDER_SITE_OTHER): Payer: Medicare Other

## 2016-07-03 ENCOUNTER — Ambulatory Visit (INDEPENDENT_AMBULATORY_CARE_PROVIDER_SITE_OTHER): Payer: Medicare Other | Admitting: Podiatry

## 2016-07-03 ENCOUNTER — Encounter: Payer: Self-pay | Admitting: Podiatry

## 2016-07-03 ENCOUNTER — Ambulatory Visit: Payer: Medicaid Other

## 2016-07-03 VITALS — BP 95/56 | HR 67 | Resp 16

## 2016-07-03 DIAGNOSIS — I739 Peripheral vascular disease, unspecified: Secondary | ICD-10-CM

## 2016-07-03 DIAGNOSIS — M2011 Hallux valgus (acquired), right foot: Secondary | ICD-10-CM | POA: Diagnosis not present

## 2016-07-03 DIAGNOSIS — M2012 Hallux valgus (acquired), left foot: Secondary | ICD-10-CM

## 2016-07-03 DIAGNOSIS — M19072 Primary osteoarthritis, left ankle and foot: Secondary | ICD-10-CM

## 2016-07-03 NOTE — Addendum Note (Signed)
Addended by: Alphia Kava D on: 07/03/2016 01:41 PM   Modules accepted: Orders

## 2016-07-03 NOTE — Progress Notes (Signed)
She presents today for a chief complaint of pain to the second metatarsophalangeal joint of the left foot and continued pain about the first metatarsophalangeal joint left foot. She has surgery 11/11/2015. She states it is still sore.  She did not have her vascular studies performed because of her insurance. She currently now has Medicaid and we'll switch over to [facility] in July.  Objective: Vital signs are stable she is alert and oriented 3 pulses remain palpable bilateral. Orthopedic evaluation does still demonstrate limited plantarflexion of the first metatarsophalangeal joint with tenderness. She has moderate to severe pain on palpation of the second metatarsophalangeal joint left. Radiographs of the left foot taken today do not demonstrate any osseous abnormalities. A single K wire and a single screw present in the first metatarsal. Joint space narrowing and subchondral sclerosis does indicate early osteoarthritic change. Mild hammertoe deformity is also noted second PIPJ.  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 Peripheral vascular disease.  Plan: Injected the second metatarsophalangeal joint today lateral to the metatarsophalangeal joint itself. Kenalog was injected. We will reorder her vascular evaluation. I will follow up with her once that comes in and we will discuss surgical intervention regarding release of the metatarsophalangeal joint removal of internal fixation and possible repair of the second toe.

## 2016-07-05 ENCOUNTER — Encounter: Payer: Self-pay | Admitting: Vascular Surgery

## 2016-07-06 ENCOUNTER — Other Ambulatory Visit: Payer: Self-pay | Admitting: *Deleted

## 2016-07-06 DIAGNOSIS — I739 Peripheral vascular disease, unspecified: Secondary | ICD-10-CM

## 2016-07-11 ENCOUNTER — Ambulatory Visit (INDEPENDENT_AMBULATORY_CARE_PROVIDER_SITE_OTHER): Payer: Medicare Other | Admitting: Vascular Surgery

## 2016-07-11 ENCOUNTER — Encounter: Payer: Self-pay | Admitting: Vascular Surgery

## 2016-07-11 ENCOUNTER — Ambulatory Visit (HOSPITAL_COMMUNITY)
Admission: RE | Admit: 2016-07-11 | Discharge: 2016-07-11 | Disposition: A | Discharge status: Home / Self Care | Payer: Medicare Other | Source: Ambulatory Visit | Attending: Vascular Surgery | Admitting: Vascular Surgery

## 2016-07-11 VITALS — BP 112/66 | HR 67 | Temp 98.7°F | Resp 16 | Ht 62.0 in | Wt 133.0 lb

## 2016-07-11 DIAGNOSIS — M25579 Pain in unspecified ankle and joints of unspecified foot: Secondary | ICD-10-CM

## 2016-07-11 DIAGNOSIS — I739 Peripheral vascular disease, unspecified: Secondary | ICD-10-CM | POA: Insufficient documentation

## 2016-07-11 NOTE — Progress Notes (Signed)
Requesting Physician: Ernestene Kiel DPM  History of Present Illness:  Patient is a 66 y.o. year old female who presents for evaluation of her arterial blood flow.  The patient currently describes a burning type pain left > right in the bunion area with edema.  She is 5 and 8 months out from B bunionectomies.  There is not rest pain.  There is no history of ulcerations on the feet.  She has no Atherosclerotic risk factors such as tobacco abuse, family history of PAD or CAD.  Past medical history includes: RA  Past Medical History:  Diagnosis Date  . Dyslipidemia     History reviewed. No pertinent surgical history.  ROS:   General:  No weight loss, Fever, chills  HEENT: No recent headaches, no nasal bleeding, no visual changes, no sore throat  Neurologic: No dizziness, blackouts, seizures. No recent symptoms of stroke or mini- stroke. No recent episodes of slurred speech, or temporary blindness.  Cardiac: No recent episodes of chest pain/pressure, no shortness of breath at rest.  No shortness of breath with exertion.  Denies history of atrial fibrillation or irregular heartbeat  Vascular: No history of rest pain in feet.  No history of claudication.  No history of non-healing ulcer, No history of DVT   Pulmonary: No home oxygen, no productive cough, no hemoptysis,  No asthma or wheezing  Musculoskeletal:  [ ]  Arthritis, [ ]  Low back pain,  [ ]  Joint pain  Hematologic:No history of hypercoagulable state.  No history of easy bleeding.  No history of anemia  Gastrointestinal: No hematochezia or melena,  No gastroesophageal reflux, no trouble swallowing  Urinary: [ ]  chronic Kidney disease, [ ]  on HD - [ ]  MWF or [ ]  TTHS, [ ]  Burning with urination, [ ]  Frequent urination, [ ]  Difficulty urinating;   Skin: No rashes  Psychological: No history of anxiety,  No history of depression  Social History Social History  Substance Use Topics  . Smoking status: Never Smoker  . Smokeless  tobacco: Never Used  . Alcohol use Not on file    Family History Family History  Problem Relation Age of Onset  . Adopted: Yes  . Heart block Mother   . Heart attack Father     Allergies  Allergies  Allergen Reactions  . Percocet [Oxycodone-Acetaminophen]     Nausea with vomiting and hives to stomach.     Current Outpatient Prescriptions  Medication Sig Dispense Refill  . calcium acetate (PHOSLO) 667 MG capsule Take 1,334 mg by mouth.    . Glucosamine HCl (SM GLUCOSAMINE HCL) 1500 MG TABS Take by mouth.    . hydroxychloroquine (PLAQUENIL) 200 MG tablet Take 300 mg by mouth.    . Multiple Vitamin (MULTIVITAMIN) capsule Take by mouth.     No current facility-administered medications for this visit.     Physical Examination  Vitals:   07/11/16 1412  BP: 112/66  Pulse: 67  Resp: 16  Temp: 98.7 F (37.1 C)  TempSrc: Oral  SpO2: 97%  Weight: 133 lb (60.3 kg)  Height: 5\' 2"  (1.575 m)    Body mass index is 24.33 kg/m.  General:  Alert and oriented, no acute distress HEENT: Normal Neck: No bruit or JVD Pulmonary: Clear to auscultation bilaterally Cardiac: Regular Rate and Rhythm without murmur Abdomen: Soft, non-tender, non-distended, no mass, no scars Skin: No rash, well heal B bunion incisions, tender to touch left 1 st and 2nd web space with tenderness to  the plantar first metatarsal head area. Extremity Pulses:  2+ radial, brachial, femoral, dorsalis pedis, posterior tibial pulses bilaterally Musculoskeletal: No deformity, mild left foot edema  Neurologic: Upper and lower extremity motor 5/5 and symmetric  DATA:  ABI's B Right triphasic/biphasic blood flow, TBI 0.87 Left triphasic flow TBI 0.90   ASSESSMENT:  No evidence of PAD, palpable pulse with normal arterial blood flow   PLAN:  The exam and ABI suggest she will do well with future surgery to her feet as needed.  She has normal arterial flow to both LE and digits.  F/U PRN.  Thomasena Edis, Meriam Chojnowski  MAUREEN PA-C [facility]  The patient was seen in conjunction with Dr. Edilia Bo   I have spoken with the patient and examined her and agree with the findings as noted above. Given that she has normal Doppler signals in both feet, and normal ABIs, she should have adequate circulation to heal any further surgery on her feet. Toe pressure on the right is 97 mmHg and toe pressure on the left is 101 mmHg. Generally a toe pressure of greater than 50, even in a diabetic patient, is adequate for healing. I'll be happy to see her back at anytime if any new vascular issues arise.  Waverly Ferrari, MD, FACS Beeper [phone] Office: [phone]

## 2016-08-14 ENCOUNTER — Ambulatory Visit (INDEPENDENT_AMBULATORY_CARE_PROVIDER_SITE_OTHER): Payer: Medicare Other | Admitting: Podiatry

## 2016-08-14 DIAGNOSIS — T847XXD Infection and inflammatory reaction due to other internal orthopedic prosthetic devices, implants and grafts, subsequent encounter: Secondary | ICD-10-CM

## 2016-08-14 DIAGNOSIS — M2450 Contracture, unspecified joint: Secondary | ICD-10-CM

## 2016-08-14 NOTE — Patient Instructions (Signed)

## 2016-08-14 NOTE — Progress Notes (Signed)
She presents today chief complaint of pain to the second metatarsophalangeal joint area and the first metatarsophalangeal joint area with a decreased range of motion of the first metatarsophalangeal joint left foot. She liked Done to Increase the Range Of Motion and Decreased Pain around the Second Toe.  Objective: Labs from the vascular evaluation and stripped relatively normal vascular flow. She has limited range of motion with pain on end range of motion of the first metatarsophalangeal joint in the second metatarsophalangeal joint of the left foot. I reviewed radiographs which do demonstrate internal fixation to the first metatarsal head. At this point I feel some of the pain she may be experiencing may be associated to the internal fixation.  Assessment: Pain limb secondary to retained internal fixation and limited range of motion of the first metatarsophalangeal joint contracted first metatarsophalangeal joint left foot.  Plan: Discussed etiology and pathology conservative or surgical therapies. At this point she signed a consent form today for removal of internal fixation release of the first metatarsophalangeal joint with aspiration and injection of the second metatarsophalangeal joint. I will follow up with her in the near future for surgical intervention.

## 2016-08-29 ENCOUNTER — Other Ambulatory Visit: Payer: Self-pay | Admitting: Podiatry

## 2016-08-29 MED ORDER — DOXYCYCLINE HYCLATE 100 MG PO TABS: 100.0000 mg | ORAL_TABLET | Freq: Two times a day (BID) | ORAL | 0 refills | Status: DC

## 2016-08-29 MED ORDER — HYDROCODONE-ACETAMINOPHEN 5-325 MG PO TABS: 1.0000 | ORAL_TABLET | Freq: Four times a day (QID) | ORAL | 0 refills | Status: DC | PRN

## 2016-08-29 MED ORDER — PROMETHAZINE HCL 25 MG PO TABS: 25.0000 mg | ORAL_TABLET | Freq: Three times a day (TID) | ORAL | 0 refills | Status: DC | PRN

## 2016-08-31 ENCOUNTER — Encounter: Payer: Self-pay | Admitting: Podiatry

## 2016-08-31 DIAGNOSIS — Z4889 Encounter for other specified surgical aftercare: Secondary | ICD-10-CM | POA: Diagnosis not present

## 2016-09-03 ENCOUNTER — Telehealth: Payer: Self-pay | Admitting: Podiatry

## 2016-09-03 NOTE — Telephone Encounter (Signed)
Yes I'm calling for Heather Mills. Dr. Al Corpus performed surgery on her on Friday. The antibiotic he prescribed, she is unable to take because she cannot keep it down. Could he please prescribe something else? You can call me back at [phone]. Thank you.

## 2016-09-03 NOTE — Telephone Encounter (Signed)
We have tried different antibiotics before.  Just have her stop the antibiotic.  There were no implants and she should be fine.

## 2016-09-03 NOTE — Telephone Encounter (Addendum)
Left message informing Heather Mills to have pt stop the antibiotic and take the other medications with light meal, and I would call again with orders. I informed [name], Dr. Al Corpus had stated stop the antibiotic. [name] states pain medication is not working as well as the last time. I asked Heather Mills if pt could take advil/ibuprofen and she stated yes, I told he she could take 2 advil OTC 4 times daily between dosing of the pain medication and that would help with the pain.

## 2016-09-06 ENCOUNTER — Ambulatory Visit (INDEPENDENT_AMBULATORY_CARE_PROVIDER_SITE_OTHER): Payer: Medicare Other

## 2016-09-06 ENCOUNTER — Encounter: Payer: Self-pay | Admitting: Podiatry

## 2016-09-06 ENCOUNTER — Ambulatory Visit (INDEPENDENT_AMBULATORY_CARE_PROVIDER_SITE_OTHER): Payer: Medicare Other | Admitting: Podiatry

## 2016-09-06 VITALS — BP 119/74 | Temp 97.6°F

## 2016-09-06 DIAGNOSIS — M2012 Hallux valgus (acquired), left foot: Secondary | ICD-10-CM

## 2016-09-06 DIAGNOSIS — Z9889 Other specified postprocedural states: Secondary | ICD-10-CM

## 2016-09-08 NOTE — Progress Notes (Signed)
She presents today status post release first metatarsophalangeal joint and removal internal fixation first metatarsophalangeal joint left foot. She states that exquisitely tenderness still sore beneath the second metatarsophalangeal joint. She states that she has not been moving the foot or the toe as she was instructed to do so.  Objective: Vital signs are stable alert and oriented 3. Pulses are palpable. Surgical site appears to be healing very nicely currently she has very good range of motion of the first metatarsophalangeal joint though it is somewhat tender.  Assessment: The majority of her pain today is at the second metatarsophalangeal joint capsulitis with mild hammertoe deformity. They did not want this addressed during surgery.  Plan: Redressed the foot today with a dry sterile compressive dressing after manipulation and range of motion her foot was very tender and she had to leave via wheelchair. I will follow up with her in 1 week

## 2016-09-13 ENCOUNTER — Ambulatory Visit (INDEPENDENT_AMBULATORY_CARE_PROVIDER_SITE_OTHER): Payer: Medicare Other | Admitting: Podiatry

## 2016-09-13 ENCOUNTER — Encounter: Payer: Self-pay | Admitting: Podiatry

## 2016-09-13 ENCOUNTER — Ambulatory Visit: Payer: Medicare Other

## 2016-09-13 VITALS — BP 116/74 | Temp 98.0°F

## 2016-09-13 DIAGNOSIS — M2450 Contracture, unspecified joint: Secondary | ICD-10-CM

## 2016-09-13 DIAGNOSIS — M2012 Hallux valgus (acquired), left foot: Secondary | ICD-10-CM

## 2016-09-13 DIAGNOSIS — Z9889 Other specified postprocedural states: Secondary | ICD-10-CM

## 2016-09-14 NOTE — Progress Notes (Signed)
Subjective: Heather Mills is a 66 y.o. is seen today in office s/p left foot ROH and joint release to the 1st MTPJ preformed on 08/31/2016 with Dr. Al Corpus. They state their pain is improved but she is still having pain. She has remained in the CAM boot. Denies any systemic complaints such as fevers, chills, nausea, vomiting. No calf pain, chest pain, shortness of breath.   Objective: General: No acute distress, AAOx3  DP/PT pulses palpable 2/4, CRT < 3 sec to all digits.  Protective sensation intact. Motor function intact.  Left foot: Incision is well coapted without any evidence of dehiscence and sutures are intact. There is still some movement across the incision. There is no surrounding erythema, ascending cellulitis, fluctuance, crepitus, malodor, drainage/purulence. There is mild edema around the surgical site. There is mild to moderate pain along the surgical site.  No other areas of tenderness to bilateral lower extremities.  No other open lesions or pre-ulcerative lesions.  No pain with calf compression, swelling, warmth, erythema.   Assessment and Plan:  Status post left foot surgery, doing well with no complications with subjective improvement in pain  -Treatment options discussed including all alternatives, risks, and complications -At today's appointment there is still some movement across incisions therefore I did leave the sutures intact today. Antibiotic 1 and was applied followed by a bandage. Keep dressing clean, dry, intact. -Continue cam boot. -Discussed first MTPJ range of motion to gradually start doing once her pain improves. -Ice/elevation -Pain medication as needed. -Monitor for any clinical signs or symptoms of infection and DVT/PE and directed to call the office immediately should any occur or go to the ER. -Follow-up in 1 week with Dr. Al Corpus or sooner if any problems arise. In the meantime, encouraged to call the office with any questions, concerns, change in symptoms.    Ovid Curd, DPM

## 2016-09-20 ENCOUNTER — Encounter: Payer: Self-pay | Admitting: Podiatry

## 2016-09-20 ENCOUNTER — Ambulatory Visit (INDEPENDENT_AMBULATORY_CARE_PROVIDER_SITE_OTHER): Payer: Medicare Other | Admitting: Podiatry

## 2016-09-20 DIAGNOSIS — M2450 Contracture, unspecified joint: Secondary | ICD-10-CM

## 2016-09-20 MED ORDER — HYDROCODONE-ACETAMINOPHEN 5-325 MG PO TABS: 1.0000 | ORAL_TABLET | Freq: Four times a day (QID) | ORAL | 0 refills | Status: DC | PRN

## 2016-09-20 NOTE — Progress Notes (Signed)
She presents today to have her sutures removed she is status post removal deep internal fixation first metatarsal of the left foot as well as release of the first metatarsophalangeal joint left foot. She states that she seems to be doing much better she states that the pain beneath the second metatarsal head is also resolved. Currently she continues to utilize her [employer].  Objective: Dry sterile dressing intact was removed demonstrates sutures are well coapted O signs of infection. No dehiscence. She has a good range of motion of the first metatarsophalangeal joint. Sutures were removed margins remain well coapted.  Assessment: Well-healing surgical foot first metatarsophalangeal joint release and removal of internal fixation.  Plan: Encouraged range of motion exercises also placed issues also wound today I will allow her to start getting this foot wet however I encouraged her to continue to wear her Darco shoe or Cam Walker for another 2 weeks then we will try to switch to a regular shoe. I did refill her pain prescription for hydrocodone which she takes 1 a day.

## 2016-09-28 NOTE — Progress Notes (Signed)
Dos 08-31-2016 Removal Fixation Deep Screw 1st left Capsulotomy, MPJ Release Joint 1st Left

## 2016-10-04 ENCOUNTER — Ambulatory Visit (INDEPENDENT_AMBULATORY_CARE_PROVIDER_SITE_OTHER): Payer: Medicare Other | Admitting: Podiatry

## 2016-10-04 DIAGNOSIS — M2450 Contracture, unspecified joint: Secondary | ICD-10-CM

## 2016-10-04 DIAGNOSIS — M2012 Hallux valgus (acquired), left foot: Secondary | ICD-10-CM

## 2016-10-04 NOTE — Progress Notes (Signed)
She presents today with her daughter stating that her surgical foot left first metatarsophalangeal joint is feeling better. She's concerned that she still has tenderness on palpation of the second toe and that she has radiating pain up the second toe.  Objective: Vital signs are stable she is alert and oriented 3 much improved range of motion of the first metatarsophalangeal joint though she does have tenderness on palpation of the PIPJ due to a rigid contracture deformity of the second toe left foot.  Assessment: Well-healing surgical foot with osteoarthritis.  Plan: Place her in toe spacers between the first and second toes and she really enjoys this. Follow up with her as needed.

## 2016-11-15 ENCOUNTER — Ambulatory Visit: Payer: Medicare Other | Admitting: Podiatry

## 2016-11-22 ENCOUNTER — Ambulatory Visit (INDEPENDENT_AMBULATORY_CARE_PROVIDER_SITE_OTHER): Payer: Medicare Other

## 2016-11-22 ENCOUNTER — Ambulatory Visit (INDEPENDENT_AMBULATORY_CARE_PROVIDER_SITE_OTHER): Payer: Medicare Other | Admitting: Podiatry

## 2016-11-22 DIAGNOSIS — M2012 Hallux valgus (acquired), left foot: Secondary | ICD-10-CM

## 2016-11-22 DIAGNOSIS — M2450 Contracture, unspecified joint: Secondary | ICD-10-CM

## 2016-11-24 NOTE — Progress Notes (Signed)
She presents today complaining of her foot still hurting across the top and the plantar surface.  Objective: Vital signs are stable she is alert and oriented 3 pulses remain palpable. She has good range of motion of the first metatarsophalangeal joint that we'll radiographic does appear to be arthritic. She has tenderness on palpation of the lesser metatarsal heads more than likely compensatory in nature also due to the fact that she has lost considerable amount of fat pad left greater than right. She also has pain on palpation to the third interdigital space of the left foot. This is consistent with neuroma as a palpable Mulder's click is obtained.  Assessment: metatarsalgia, capsulitis, neuroma.  Plan: Offered her injection she declined follow-up with me in 6-8 weeks.

## 2016-12-25 ENCOUNTER — Encounter: Payer: Self-pay | Admitting: Podiatry

## 2016-12-25 ENCOUNTER — Ambulatory Visit (INDEPENDENT_AMBULATORY_CARE_PROVIDER_SITE_OTHER): Payer: Medicare Other | Admitting: Podiatry

## 2016-12-25 DIAGNOSIS — G5792 Unspecified mononeuropathy of left lower limb: Secondary | ICD-10-CM | POA: Diagnosis not present

## 2016-12-25 DIAGNOSIS — L905 Scar conditions and fibrosis of skin: Secondary | ICD-10-CM | POA: Diagnosis not present

## 2016-12-25 DIAGNOSIS — R52 Pain, unspecified: Secondary | ICD-10-CM | POA: Diagnosis not present

## 2016-12-25 NOTE — Progress Notes (Signed)
She presents today for follow-up of pain to her left foot. She states she still has pain right in here she points between the first and second metatarsals.  Objective: She still has pain on deep palpation of the first intermetatarsal space. This appears to be scar tissue with pain about mid diaphyseal region on deep palpation. Minimal edema no erythema cellulitis drainage or odor inflammation beneath the second metatarsophalangeal joint has resolved and is no longer tender on range of motion and on palpation. She still has tenderness on palpation of the toe along the medial aspect of the second digit and distally.  Assessment: Scar tissue well-healing surgical foot left. 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  Plan: I injected the first interdigital space and intermetatarsal space today after sterile Betadine skin prep was achieved. She was given ethyl chloride preparation for the injection and tolerated the injection well. I injected the 20 mg of Kenalog into the scar tissue which was easily identifiable in the first intermetatarsal space. I will follow-up with her in a couple weeks at which time I hope that she this has broken down.

## 2017-02-05 ENCOUNTER — Ambulatory Visit (INDEPENDENT_AMBULATORY_CARE_PROVIDER_SITE_OTHER): Payer: Medicare Other | Admitting: Podiatry

## 2017-02-05 ENCOUNTER — Encounter: Payer: Self-pay | Admitting: Podiatry

## 2017-02-05 ENCOUNTER — Ambulatory Visit: Payer: Medicare Other | Admitting: Podiatry

## 2017-02-05 DIAGNOSIS — L905 Scar conditions and fibrosis of skin: Secondary | ICD-10-CM

## 2017-02-05 DIAGNOSIS — G5792 Unspecified mononeuropathy of left lower limb: Secondary | ICD-10-CM | POA: Diagnosis not present

## 2017-02-05 DIAGNOSIS — R52 Pain, unspecified: Secondary | ICD-10-CM

## 2017-02-05 NOTE — Progress Notes (Signed)
She presents today for follow-up of her painful first intermetatarsal space.  She states that it may be some better but is still painful and numb with burning between her toes.  Objective: Neuritis first intermetatarsal space left foot is still tender on palpation.  Pulses remain palpable.  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  Plan: I injected the area today with her first dose of dehydrated alcohol I will follow-up with her in 3 weeks for another injection.

## 2017-02-19 ENCOUNTER — Ambulatory Visit: Payer: Medicare Other | Admitting: Podiatry

## 2017-02-26 ENCOUNTER — Ambulatory Visit: Payer: Medicare Other | Admitting: Podiatry

## 2017-03-05 ENCOUNTER — Encounter: Payer: Self-pay | Admitting: Podiatry

## 2017-03-05 ENCOUNTER — Ambulatory Visit (INDEPENDENT_AMBULATORY_CARE_PROVIDER_SITE_OTHER): Payer: Medicare Other | Admitting: Podiatry

## 2017-03-05 DIAGNOSIS — R52 Pain, unspecified: Secondary | ICD-10-CM | POA: Diagnosis not present

## 2017-03-05 DIAGNOSIS — L905 Scar conditions and fibrosis of skin: Secondary | ICD-10-CM

## 2017-03-05 DIAGNOSIS — G5792 Unspecified mononeuropathy of left lower limb: Secondary | ICD-10-CM

## 2017-03-05 MED ORDER — GABAPENTIN 100 MG PO CAPS: 100.0000 mg | ORAL_CAPSULE | Freq: Every day | ORAL | 2 refills | Status: DC

## 2017-03-05 NOTE — Progress Notes (Signed)
She presents today with her daughter stating that she still has pain to the first metatarsophalangeal joint stating that is really not a lot of change.  Daughter goes on to say that her pain actually worsened and is more painful across the foot now.  Objective: No change in neurovascular status still has pain on palpation between the metatarsal phalangeal joints.  There is no erythema edema cellulitis drainage or odor.  Assessment: Continue neuritis.  Plan: I wanted to give her another injection but her daughter recommended that she not have another injection because she is afraid he was going to continue to make everything worse.  The only other alternative I explained to her was oral medications.  At this point I started her on gabapentin 100 mg at nighttime.  I want to see if this will alleviate her nocturnal symptoms.  If so then we can have one today.  I feel that it is going to be necessary to continue injection therapy she did not want to.  At this point I provided her with metatarsal silicone pads but she refused to take them.  Her daughter would like for her to consider an MRI if she has not improved next visit.

## 2017-04-09 ENCOUNTER — Ambulatory Visit: Payer: Medicare Other | Admitting: Podiatry

## 2018-04-02 DIAGNOSIS — F4323 Adjustment disorder with mixed anxiety and depressed mood: Secondary | ICD-10-CM | POA: Insufficient documentation

## 2019-02-19 ENCOUNTER — Ambulatory Visit: Payer: Medicare Other | Attending: Internal Medicine

## 2019-02-19 DIAGNOSIS — Z23 Encounter for immunization: Secondary | ICD-10-CM | POA: Insufficient documentation

## 2019-02-19 NOTE — Progress Notes (Signed)
   Covid-19 Vaccination Clinic  Name:  Heather Mills    MRN: [id number] DOB: [birth year]  02/19/2019  [name] was observed post Covid-19 immunization for 15 minutes without incidence. She was provided with Vaccine Information Sheet and instruction to access the V-Safe system.   [name] was instructed to call 911 with any severe reactions post vaccine: Marland Kitchen Difficulty breathing  . Swelling of your face and throat  . A fast heartbeat  . A bad rash all over your body  . Dizziness and weakness    Immunizations Administered    Name Date Dose VIS Date Route   Pfizer COVID-19 Vaccine 02/19/2019  6:56 PM 0.3 mL 01/09/2019 Intramuscular   Manufacturer: [employer], [employer]   Lot: [id number]   NDC: [address]-2

## 2019-03-12 ENCOUNTER — Ambulatory Visit: Payer: Medicare Other | Attending: Internal Medicine

## 2019-03-12 DIAGNOSIS — Z23 Encounter for immunization: Secondary | ICD-10-CM | POA: Insufficient documentation

## 2019-03-12 NOTE — Progress Notes (Signed)
   Covid-19 Vaccination Clinic  Name:  Heather Mills    MRN: [id number] DOB: 18-Feb-1950  03/12/2019  [name] was observed post Covid-19 immunization for 15 minutes without incidence. She was provided with Vaccine Information Sheet and instruction to access the V-Safe system.   [name] was instructed to call 911 with any severe reactions post vaccine: Marland Kitchen Difficulty breathing  . Swelling of your face and throat  . A fast heartbeat  . A bad rash all over your body  . Dizziness and weakness    Immunizations Administered    Name Date Dose VIS Date Route   Pfizer COVID-19 Vaccine 03/12/2019 12:55 PM 0.3 mL 01/09/2019 Intramuscular   Manufacturer: [employer], [employer]   Lot: [id number]   NDC: [address]-0

## 2019-06-17 ENCOUNTER — Telehealth: Payer: Self-pay | Admitting: *Deleted

## 2019-06-17 NOTE — Telephone Encounter (Signed)
Patient called and left a message to call back and I was returned the call and left a message for the patient. [name]

## 2019-11-10 ENCOUNTER — Ambulatory Visit: Payer: Medicare Other | Admitting: Podiatry

## 2019-11-10 ENCOUNTER — Ambulatory Visit (INDEPENDENT_AMBULATORY_CARE_PROVIDER_SITE_OTHER): Payer: Medicare Other | Admitting: Podiatry

## 2019-11-10 ENCOUNTER — Other Ambulatory Visit: Payer: Self-pay

## 2019-11-10 ENCOUNTER — Encounter: Payer: Self-pay | Admitting: Podiatry

## 2019-11-10 DIAGNOSIS — M7742 Metatarsalgia, left foot: Secondary | ICD-10-CM

## 2019-11-10 DIAGNOSIS — S99922A Unspecified injury of left foot, initial encounter: Secondary | ICD-10-CM | POA: Diagnosis not present

## 2019-11-10 DIAGNOSIS — M778 Other enthesopathies, not elsewhere classified: Secondary | ICD-10-CM | POA: Diagnosis not present

## 2019-11-10 NOTE — Addendum Note (Signed)
Addended by: Lottie Rater E on: 11/10/2019 12:35 PM   Modules accepted: Orders

## 2019-11-10 NOTE — Progress Notes (Signed)
She presents today with her daughter for follow-up of pain to the surgical foot left.  She states they have not been back since 19 secondary to Covid.  She states that the foot is the same if not worse still experiencing severe tenderness beneath the metatarsophalangeal joints and beneath the first metatarsophalangeal joint where a bunionectomy was performed.  She is limited ability to ambulate for long periods of time.  History of rheumatological issues daughter does not know the name of the arthritis but she does take Plaquenil for it.  She does relate a history of back disease with a history of surgery left side as well.  She still has pain down the front of her leg into her knee in the front of her thigh.  Objective: Vital signs are stable she is alert and oriented x3.  Pulses are palpable.  Physical exam is essentially unchanged with the majority of her symptoms beneath the lesser metatarsophalangeal joints.  She still has pain on end range of motion of the metatarsophalangeal joints and there is lateral deviation of the toes on the foot and limited range of motion of the first metatarsophalangeal joint.  She has pain on palpation to the forefoot in general.  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  Plan: Currently requesting an MRI of the forefoot left.  This is for surgical consideration.

## 2019-12-02 ENCOUNTER — Other Ambulatory Visit: Payer: Self-pay

## 2019-12-02 ENCOUNTER — Ambulatory Visit
Admission: RE | Admit: 2019-12-02 | Discharge: 2019-12-02 | Disposition: A | Discharge status: Home / Self Care | Payer: Medicare Other | Source: Ambulatory Visit | Attending: Podiatry | Admitting: Podiatry

## 2019-12-02 DIAGNOSIS — M778 Other enthesopathies, not elsewhere classified: Secondary | ICD-10-CM

## 2019-12-02 DIAGNOSIS — M7742 Metatarsalgia, left foot: Secondary | ICD-10-CM

## 2019-12-02 DIAGNOSIS — S99922A Unspecified injury of left foot, initial encounter: Secondary | ICD-10-CM

## 2019-12-02 IMAGING — MR MR FOOT*L* W/O CM
4 of 5 series · 18 of 40 positions shown · non-contrast
Comparison: None.

CLINICAL DATA: Foot trauma, foot pain along the plantar aspect.

EXAM:
MRI OF THE LEFT FOOT WITHOUT CONTRAST
TECHNIQUE: Multiplanar, multisequence MR imaging of the left forefoot was
performed. No intravenous contrast was administered.

[Series 4: T1 · coronal · 3.0mm · 0.19mm/px · 3 of 46 slices shown (1 of 2)]
[im 9/46]
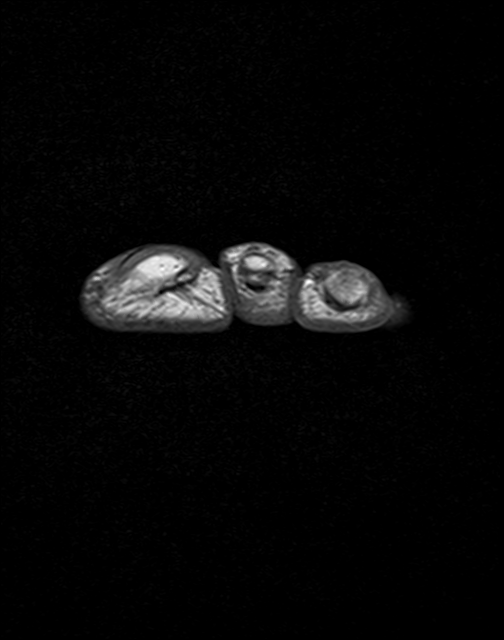
[im 25/46]
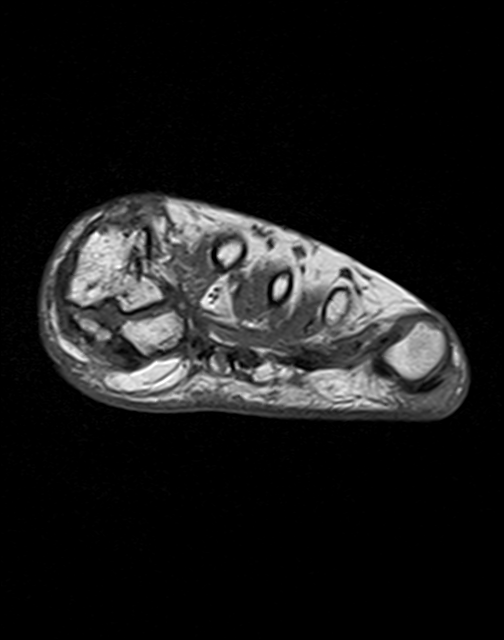
[im 41/46]
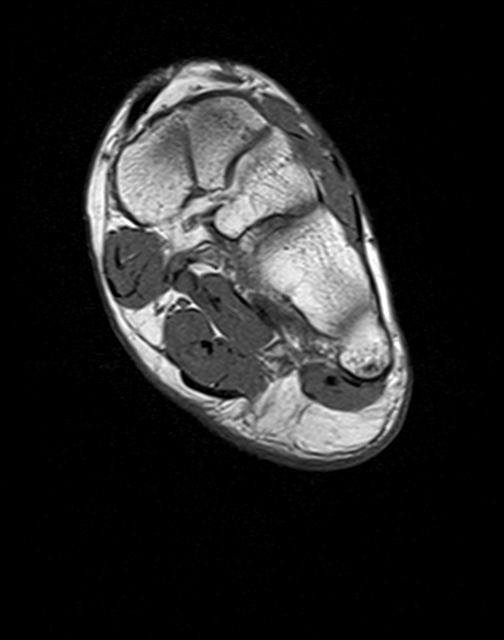

[Series 5: T2 fat-sat · coronal · 3.0mm · 0.19mm/px · 9 of 46 slices shown (1 of 2)]
[im 1/46]
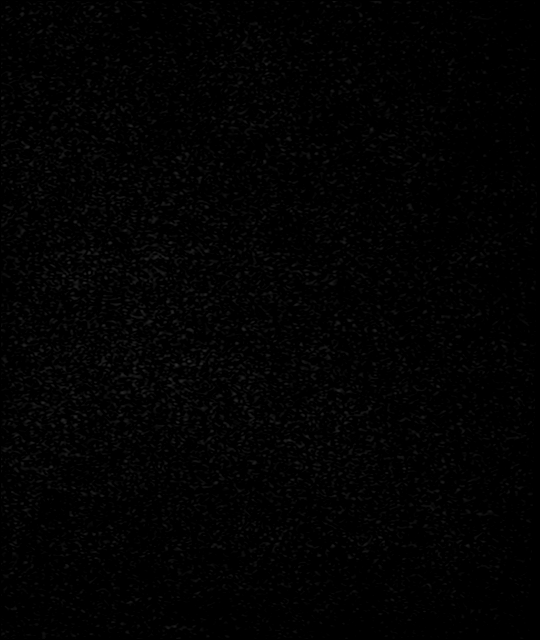
[im 5/46]
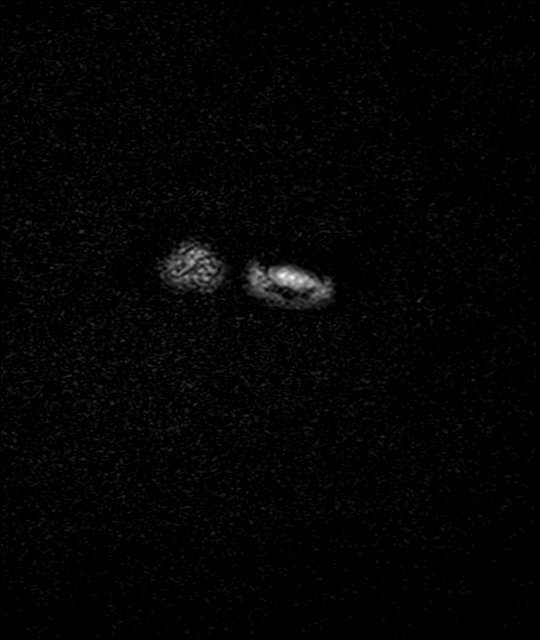
[im 10/46]
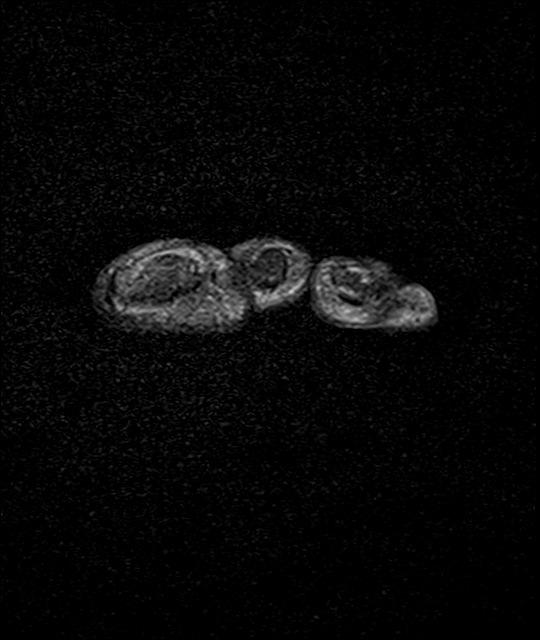
[im 14/46]
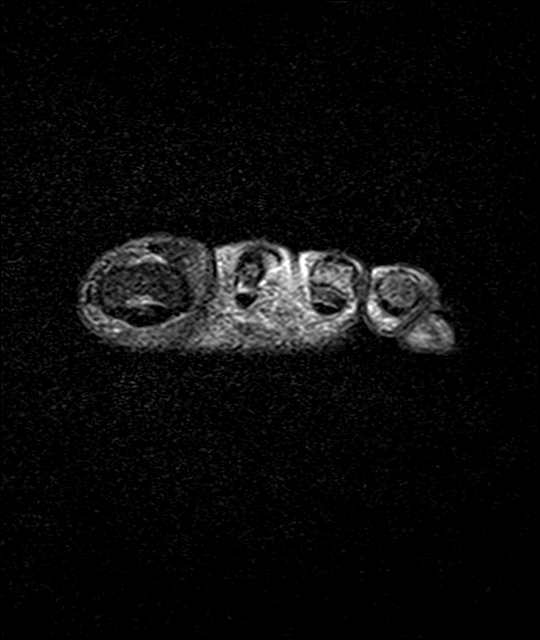
[im 19/46]
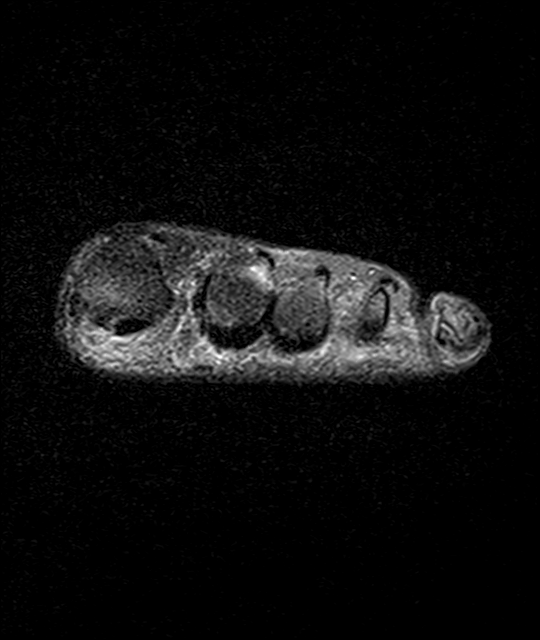
[im 23/46]
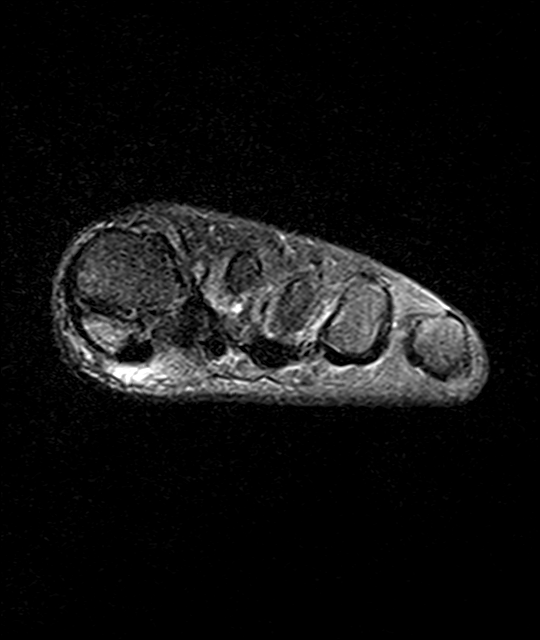
[im 28/46]
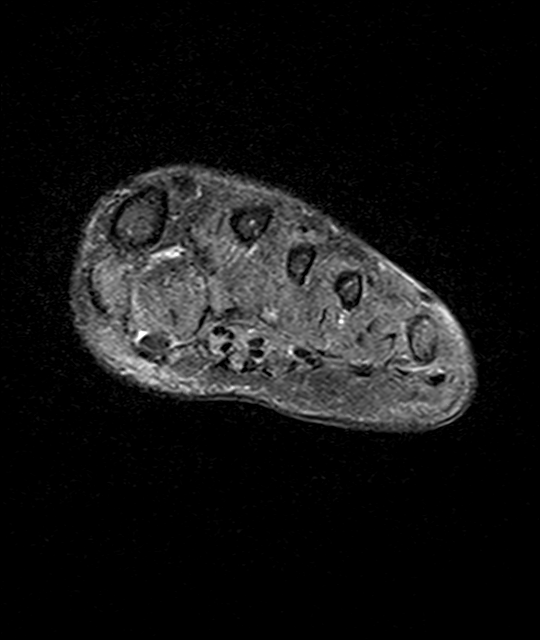
[im 32/46]
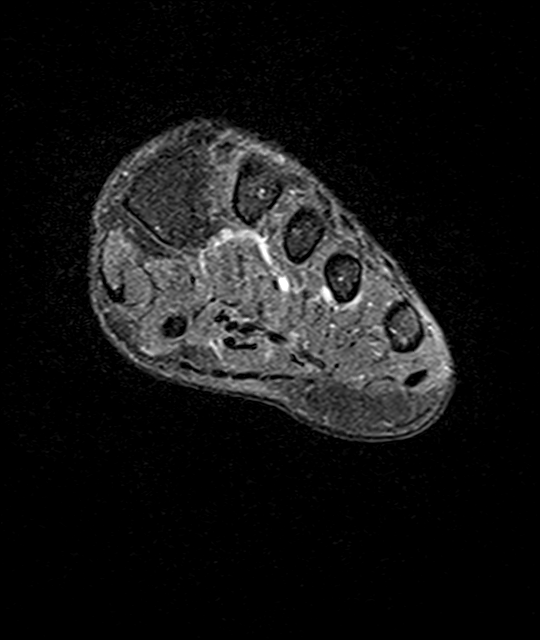
[im 41/46]
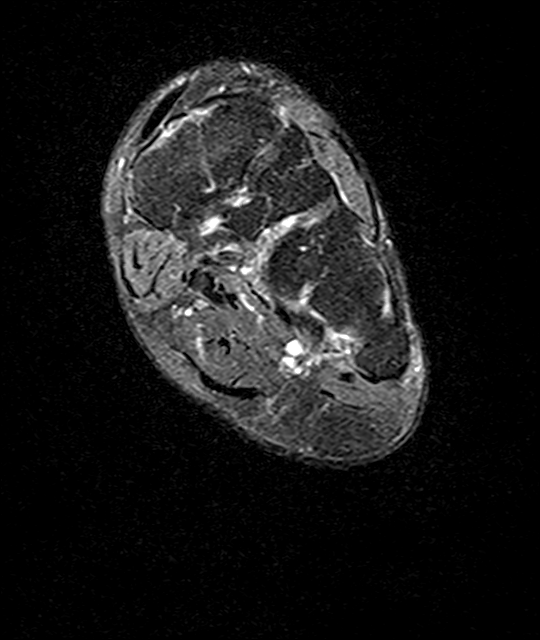

[Series 6: T2 fat-sat · axial · 3.0mm · 0.35mm/px · z∈[-148,-73]mm · 3 of 22 slices shown (2 of 2)]
[im 1/22]
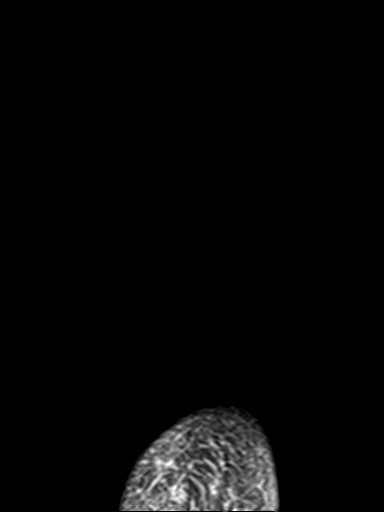
[im 11/22]
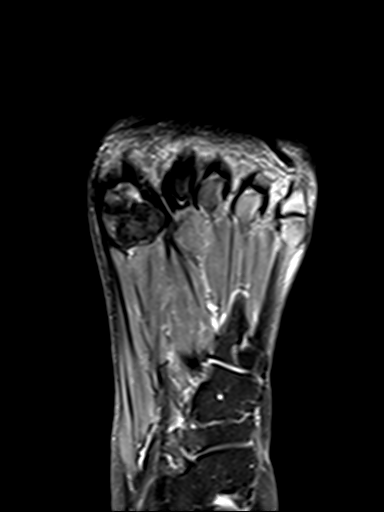
[im 22/22]
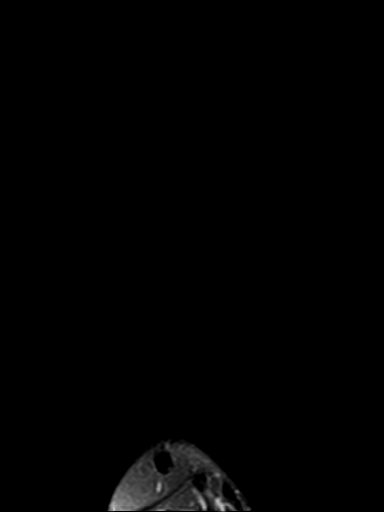

[Series 7: T1 · axial · 3.0mm · 0.35mm/px · z∈[-148,-73]mm · 3 of 22 slices shown (2 of 2)]
[im 1/22]
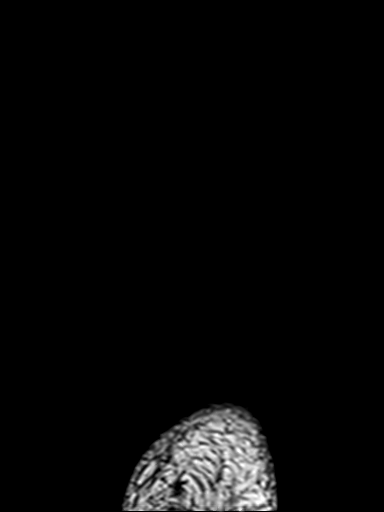
[im 11/22]
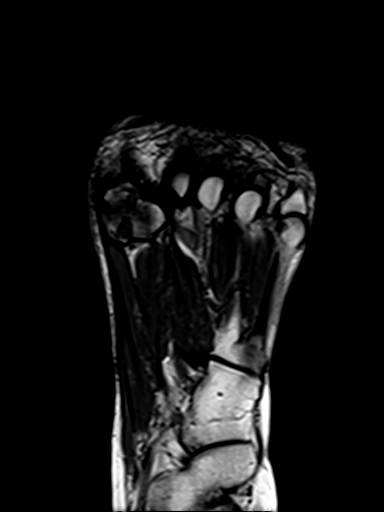
[im 22/22]
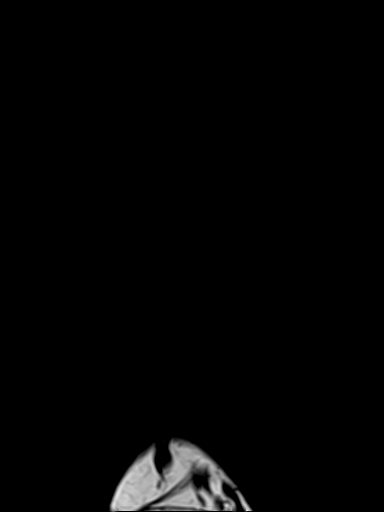

[18 of 40 positions shown; findings below may reference images not displayed]

FINDINGS: Bones/Joint/Cartilage

Severe bone marrow edema in the medial hallux sesamoid with
peripheral hypertrophic changes likely reflecting an osseous
contusion versus more likely reactive marrow edema secondary to
arthritis at the medial hallux sesamoid-metatarsal head
articulation.

No acute fracture or dislocation. Normal alignment. No joint
effusion.

Moderate osteoarthritis of the first MTP joint.

Ligaments

Collateral ligaments are intact. Lisfranc ligament is intact.
Plantar plate is intact.

Muscles and Tendons
Mild tendinosis of the flexor hallucis longus tendon at the level of
the first metatarsal neck. Muscles are normal.

Soft tissue
No fluid collection or hematoma.  No soft tissue mass.
IMPRESSION: 1. Severe bone marrow edema in the medial hallux sesamoid with
peripheral hypertrophic changes likely reflecting an osseous
contusion versus more likely reactive marrow edema secondary to
arthritis at the medial hallux sesamoid-metatarsal head
articulation.
2. Mild tendinosis of the flexor hallucis longus tendon at the level
of the first metatarsal neck.
3. Moderate osteoarthritis of the first MTP joint.

## 2019-12-31 ENCOUNTER — Other Ambulatory Visit: Payer: Self-pay

## 2019-12-31 ENCOUNTER — Ambulatory Visit: Payer: Medicare Other | Admitting: Podiatry

## 2019-12-31 ENCOUNTER — Ambulatory Visit (INDEPENDENT_AMBULATORY_CARE_PROVIDER_SITE_OTHER): Payer: Medicare Other | Admitting: Podiatry

## 2019-12-31 ENCOUNTER — Ambulatory Visit (INDEPENDENT_AMBULATORY_CARE_PROVIDER_SITE_OTHER): Payer: Medicare Other

## 2019-12-31 DIAGNOSIS — M778 Other enthesopathies, not elsewhere classified: Secondary | ICD-10-CM | POA: Diagnosis not present

## 2019-12-31 DIAGNOSIS — M25872 Other specified joint disorders, left ankle and foot: Secondary | ICD-10-CM | POA: Diagnosis not present

## 2019-12-31 DIAGNOSIS — M19272 Secondary osteoarthritis, left ankle and foot: Secondary | ICD-10-CM | POA: Diagnosis not present

## 2019-12-31 NOTE — Patient Instructions (Signed)
Wear the boot when up and walking

## 2020-01-03 NOTE — Progress Notes (Signed)
Subjective:  Patient ID: Heather Mills, female    DOB: Apr 27, 1950,  MRN: [id number]  Chief Complaint  Patient presents with  . Foot Pain    MRI results and surgery consult    69 y.o. female presents with the above complaint. History confirmed with patient.  I am seeing her as a consultation from Dr. Al Corpus.  She recently completed an MRI.  She has a chronic left foot pain that is worsening since she had bunion surgery in 2019.  She is here with her daughter today who provides translation.  She states after bunion surgery the screw was removed and the pain did not get better.  She relates the pain about the left 1st metatarsophalangeal joint.  She also had bunion surgery on the right foot but this does not hurt her.  Objective:  Physical Exam: warm, good capillary refill, no trophic changes or ulcerative lesions, normal DP and PT pulses and normal sensory exam.  Bilaterally she has well-healed surgical scars over the 1st metatarsophalangeal joint.  She has pain with end range of motion of the left 1st metatarsophalangeal joint.  She also has pain on the sesamoid complex.  Study Result  Narrative & Impression  CLINICAL DATA:  Foot trauma, foot pain along the plantar aspect.  EXAM: MRI OF THE LEFT FOOT WITHOUT CONTRAST  TECHNIQUE: Multiplanar, multisequence MR imaging of the left forefoot was performed. No intravenous contrast was administered.  COMPARISON:  None.  FINDINGS: Bones/Joint/Cartilage  Severe bone marrow edema in the medial hallux sesamoid with peripheral hypertrophic changes likely reflecting an osseous contusion versus more likely reactive marrow edema secondary to arthritis at the medial hallux sesamoid-metatarsal head articulation.  No acute fracture or dislocation. Normal alignment. No joint effusion.  Moderate osteoarthritis of the first MTP joint.  Ligaments  Collateral ligaments are intact. Lisfranc ligament is intact. Plantar plate is  intact.  Muscles and Tendons Mild tendinosis of the flexor hallucis longus tendon at the level of the first metatarsal neck. Muscles are normal.  Soft tissue No fluid collection or hematoma.  No soft tissue mass.  IMPRESSION: 1. Severe bone marrow edema in the medial hallux sesamoid with peripheral hypertrophic changes likely reflecting an osseous contusion versus more likely reactive marrow edema secondary to arthritis at the medial hallux sesamoid-metatarsal head articulation. 2. Mild tendinosis of the flexor hallucis longus tendon at the level of the first metatarsal neck. 3. Moderate osteoarthritis of the first MTP joint.   Electronically Signed   By: Elige Ko   On: 12/02/2019 14:18    Assessment:   1. Capsulitis of foot, left   2. Other secondary osteoarthritis of left foot   3. Sesamoiditis of left foot      Plan:  Patient was evaluated and treated and all questions answered.  I saw and evaluate the patient and discussed her current condition and treatment options with her daughter and the patient in detail.  I trigger addressed all their questions.  Discussed with him that the majority of her pain seems to be consistent with osteoarthritis of both the 1st MTPJ as well as the sesamoid complex.  Additionally she seems to have clinical and radiographic evidence of sesamoiditis of the tibial sesamoid.  Her daughter says that she is sure that there is some sort of nerve damage happening but I don't have any clinical subjective or objective findings of this.  I discussed with her we could order her nerve conduction studies and electromyography bilateral and how helpful this will be  at this point.   I recommend we provide her some support immobilization in a short top CAM boot and if she is not improved in 1 month to consider injection of the 1st metatarsophalangeal joint.  We discussed that if that the sesamoiditis does not improve that excision of the sesamoid could  provide some relief but there are obviously risks and benefits to this including the risk of hallux valgus recurrence.  She may return to see me or Dr. Al Corpus if they prefer

## 2020-03-25 ENCOUNTER — Telehealth: Payer: Self-pay | Admitting: Podiatry

## 2020-03-25 NOTE — Telephone Encounter (Signed)
Patients daughter calling to know if patient has received cortisone injection within the last 3 years, and if she should come in for another one. Please advise.

## 2020-03-28 ENCOUNTER — Ambulatory Visit: Payer: Medicare Other | Admitting: Podiatry

## 2020-03-28 NOTE — Telephone Encounter (Signed)
She may have another cortisone injection. You may put her with Dr. Lilian Kapur. I think they have lost confidence with me to some degree.

## 2020-03-28 NOTE — Telephone Encounter (Signed)
Schedule patient for cortisone injection with Dr. Lilian Kapur... per Dr. Al Corpus

## 2020-03-29 NOTE — Telephone Encounter (Signed)
Okay; thanks.

## 2020-03-29 NOTE — Telephone Encounter (Signed)
Pt had an appointment and cx, I called patient to follow back up to schedule and she is not sure if she wants too just yet

## 2020-03-31 ENCOUNTER — Ambulatory Visit (INDEPENDENT_AMBULATORY_CARE_PROVIDER_SITE_OTHER): Payer: Medicare Other | Admitting: Podiatry

## 2020-03-31 ENCOUNTER — Other Ambulatory Visit: Payer: Self-pay

## 2020-03-31 ENCOUNTER — Encounter: Payer: Self-pay | Admitting: Podiatry

## 2020-03-31 DIAGNOSIS — M778 Other enthesopathies, not elsewhere classified: Secondary | ICD-10-CM | POA: Diagnosis not present

## 2020-03-31 DIAGNOSIS — M25872 Other specified joint disorders, left ankle and foot: Secondary | ICD-10-CM | POA: Diagnosis not present

## 2020-03-31 DIAGNOSIS — K219 Gastro-esophageal reflux disease without esophagitis: Secondary | ICD-10-CM | POA: Insufficient documentation

## 2020-03-31 MED ORDER — TRIAMCINOLONE ACETONIDE 40 MG/ML IJ SUSP
40.0000 mg | Freq: Once | INTRAMUSCULAR | Status: AC
  Administered 2020-03-31: 40 mg

## 2020-03-31 NOTE — Progress Notes (Signed)
She presents today with her daughter for follow-up of her capsulitis and sesamoiditis of the first metatarsophalangeal joint of the left foot.  States that she would like to have an injection.  Objective: Vital signs are stable alert oriented x3 still has limited range of motion of the first metatarsophalangeal joint with pain on palpation of the tibial sesamoid.  She also has pain on palpation to the second interdigital space of the left foot with no radiating pains.  Assessment: Sesamoiditis tibial with osteoarthritis.  Neuroma or capsulitis second interdigital space left foot.  Plan: Injected these areas today both with 10 mg of Kenalog 5 mg Marcaine point of maximal tenderness.  Tolerated procedure well she will follow-up with [place] on an as-needed basis.

## 2020-09-05 ENCOUNTER — Emergency Department (HOSPITAL_COMMUNITY)
Admission: EM | Admit: 2020-09-05 | Discharge: 2020-09-06 | Disposition: A | Discharge status: Home / Self Care | Payer: Medicare Other | Attending: Emergency Medicine | Admitting: Emergency Medicine

## 2020-09-05 ENCOUNTER — Emergency Department (HOSPITAL_COMMUNITY): Payer: Medicare Other

## 2020-09-05 DIAGNOSIS — R55 Syncope and collapse: Secondary | ICD-10-CM | POA: Diagnosis present

## 2020-09-05 DIAGNOSIS — R079 Chest pain, unspecified: Secondary | ICD-10-CM | POA: Insufficient documentation

## 2020-09-05 DIAGNOSIS — M542 Cervicalgia: Secondary | ICD-10-CM | POA: Diagnosis not present

## 2020-09-05 DIAGNOSIS — R5383 Other fatigue: Secondary | ICD-10-CM | POA: Insufficient documentation

## 2020-09-05 DIAGNOSIS — R519 Headache, unspecified: Secondary | ICD-10-CM | POA: Insufficient documentation

## 2020-09-05 DIAGNOSIS — R0789 Other chest pain: Secondary | ICD-10-CM

## 2020-09-05 DIAGNOSIS — Z79899 Other long term (current) drug therapy: Secondary | ICD-10-CM | POA: Insufficient documentation

## 2020-09-05 LAB — BASIC METABOLIC PANEL
Anion gap: 9 (ref 5–15)
BUN: 15 mg/dL (ref 8–23)
CO2: 25 mmol/L (ref 22–32)
Calcium: 9.3 mg/dL (ref 8.9–10.3)
Chloride: 105 mmol/L (ref 98–111)
Creatinine, Ser: 0.8 mg/dL (ref 0.44–1.00)
GFR, Estimated: >60 mL/min (ref >60)
Glucose, Bld: 95 mg/dL (ref 70–99)
Potassium: 3.9 mmol/L (ref 3.5–5.1)
Sodium: 139 mmol/L (ref 135–145)

## 2020-09-05 LAB — CBC
HCT: 42.2 % (ref 36.0–46.0)
Hemoglobin: 13 g/dL (ref 12.0–15.0)
MCH: 31.6 pg (ref 26.0–34.0)
MCHC: 30.8 g/dL (ref 30.0–36.0)
MCV: 102.4 fL — ABNORMAL HIGH (ref 80.0–100.0)
Platelets: 224 10*3/uL (ref 150–400)
RBC: 4.12 MIL/uL (ref 3.87–5.11)
RDW: 13.5 % (ref 11.5–15.5)
WBC: 11.4 10*3/uL — ABNORMAL HIGH (ref 4.0–10.5)
nRBC: 0 % (ref 0.0–0.2)

## 2020-09-05 LAB — TROPONIN I (HIGH SENSITIVITY)
Troponin I (High Sensitivity): 3 ng/L (ref <18)
Troponin I (High Sensitivity): 4 ng/L (ref <18)

## 2020-09-05 LAB — MAGNESIUM: Magnesium: 2.2 mg/dL (ref 1.7–2.4)

## 2020-09-05 IMAGING — DX DG CHEST 2V
2 series · 2 of 2 positions shown · non-contrast
Comparison: None.

CLINICAL DATA: Chest pain and shortness of breath.

EXAM:
CHEST - 2 VIEW

[chest pa]
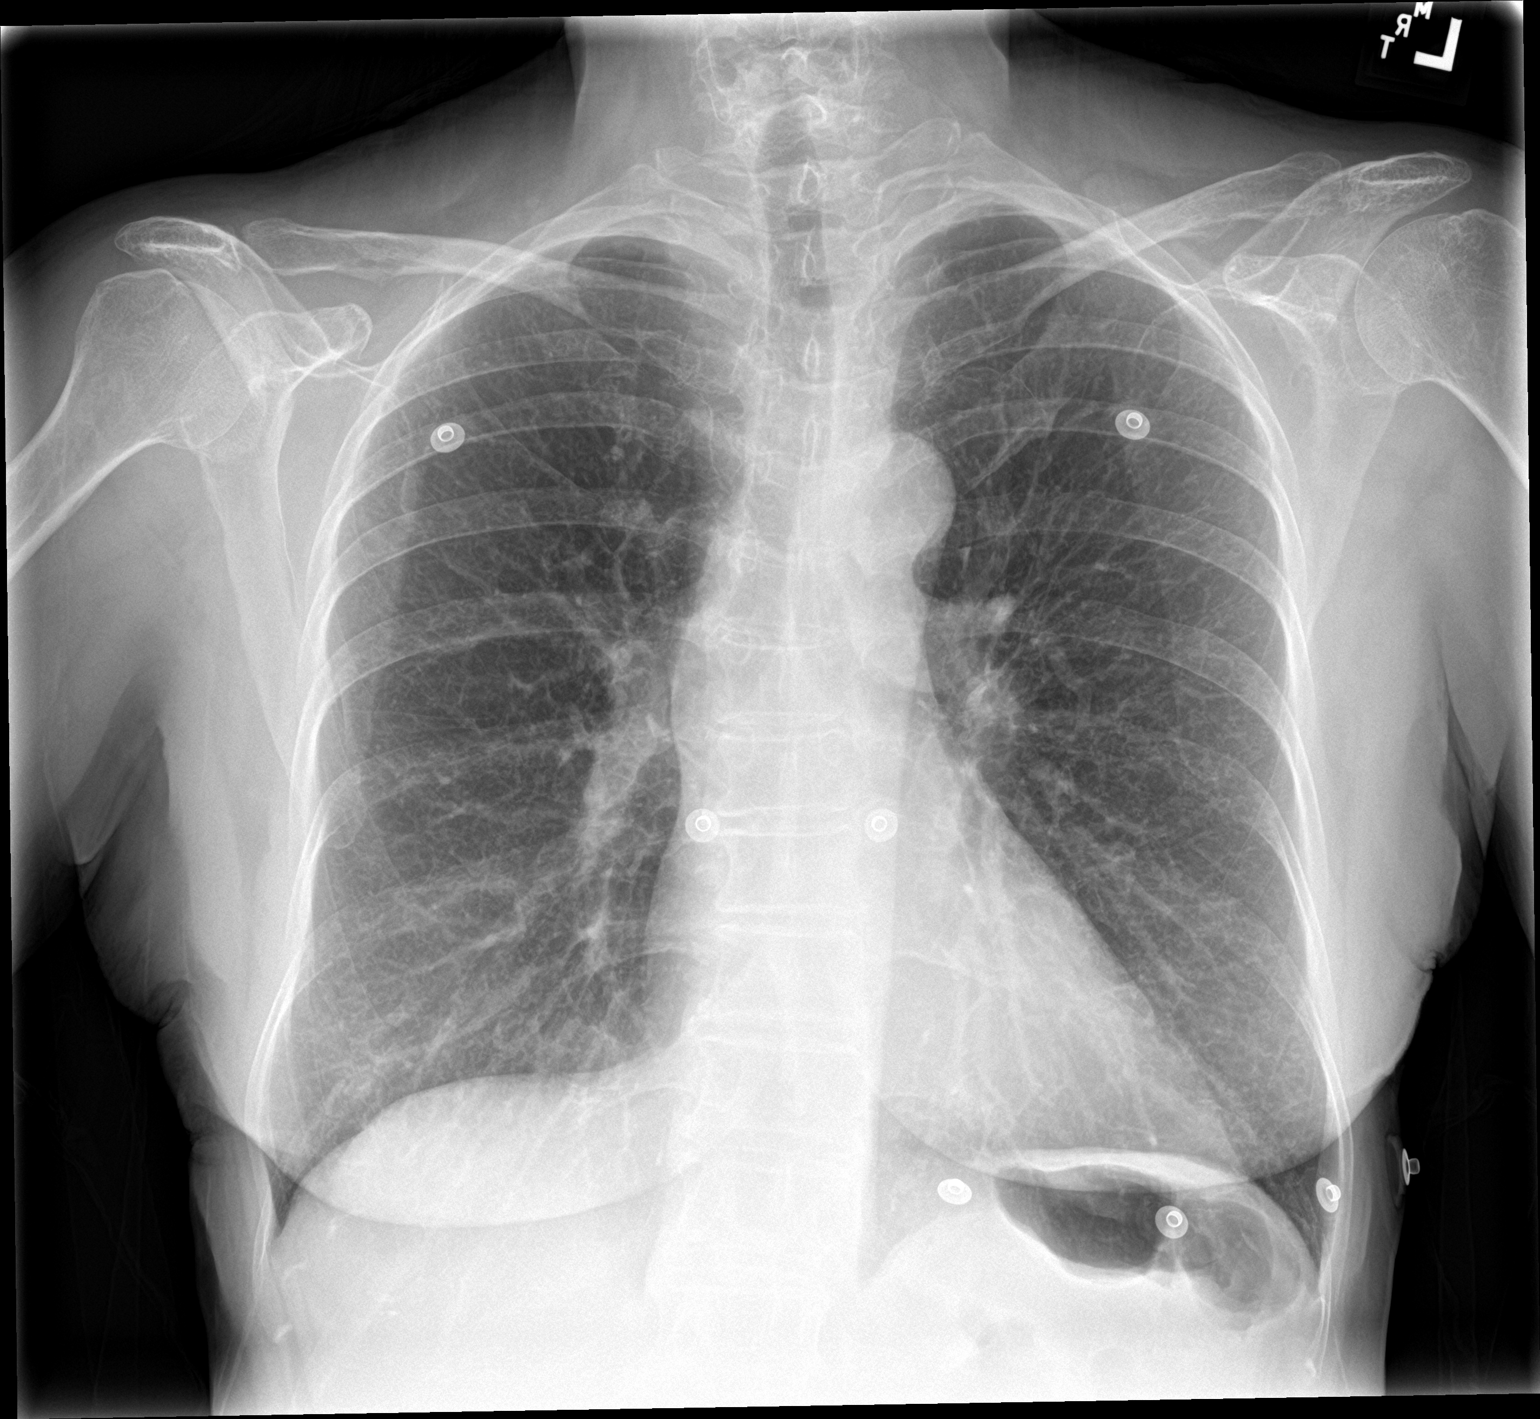

[chest lat]
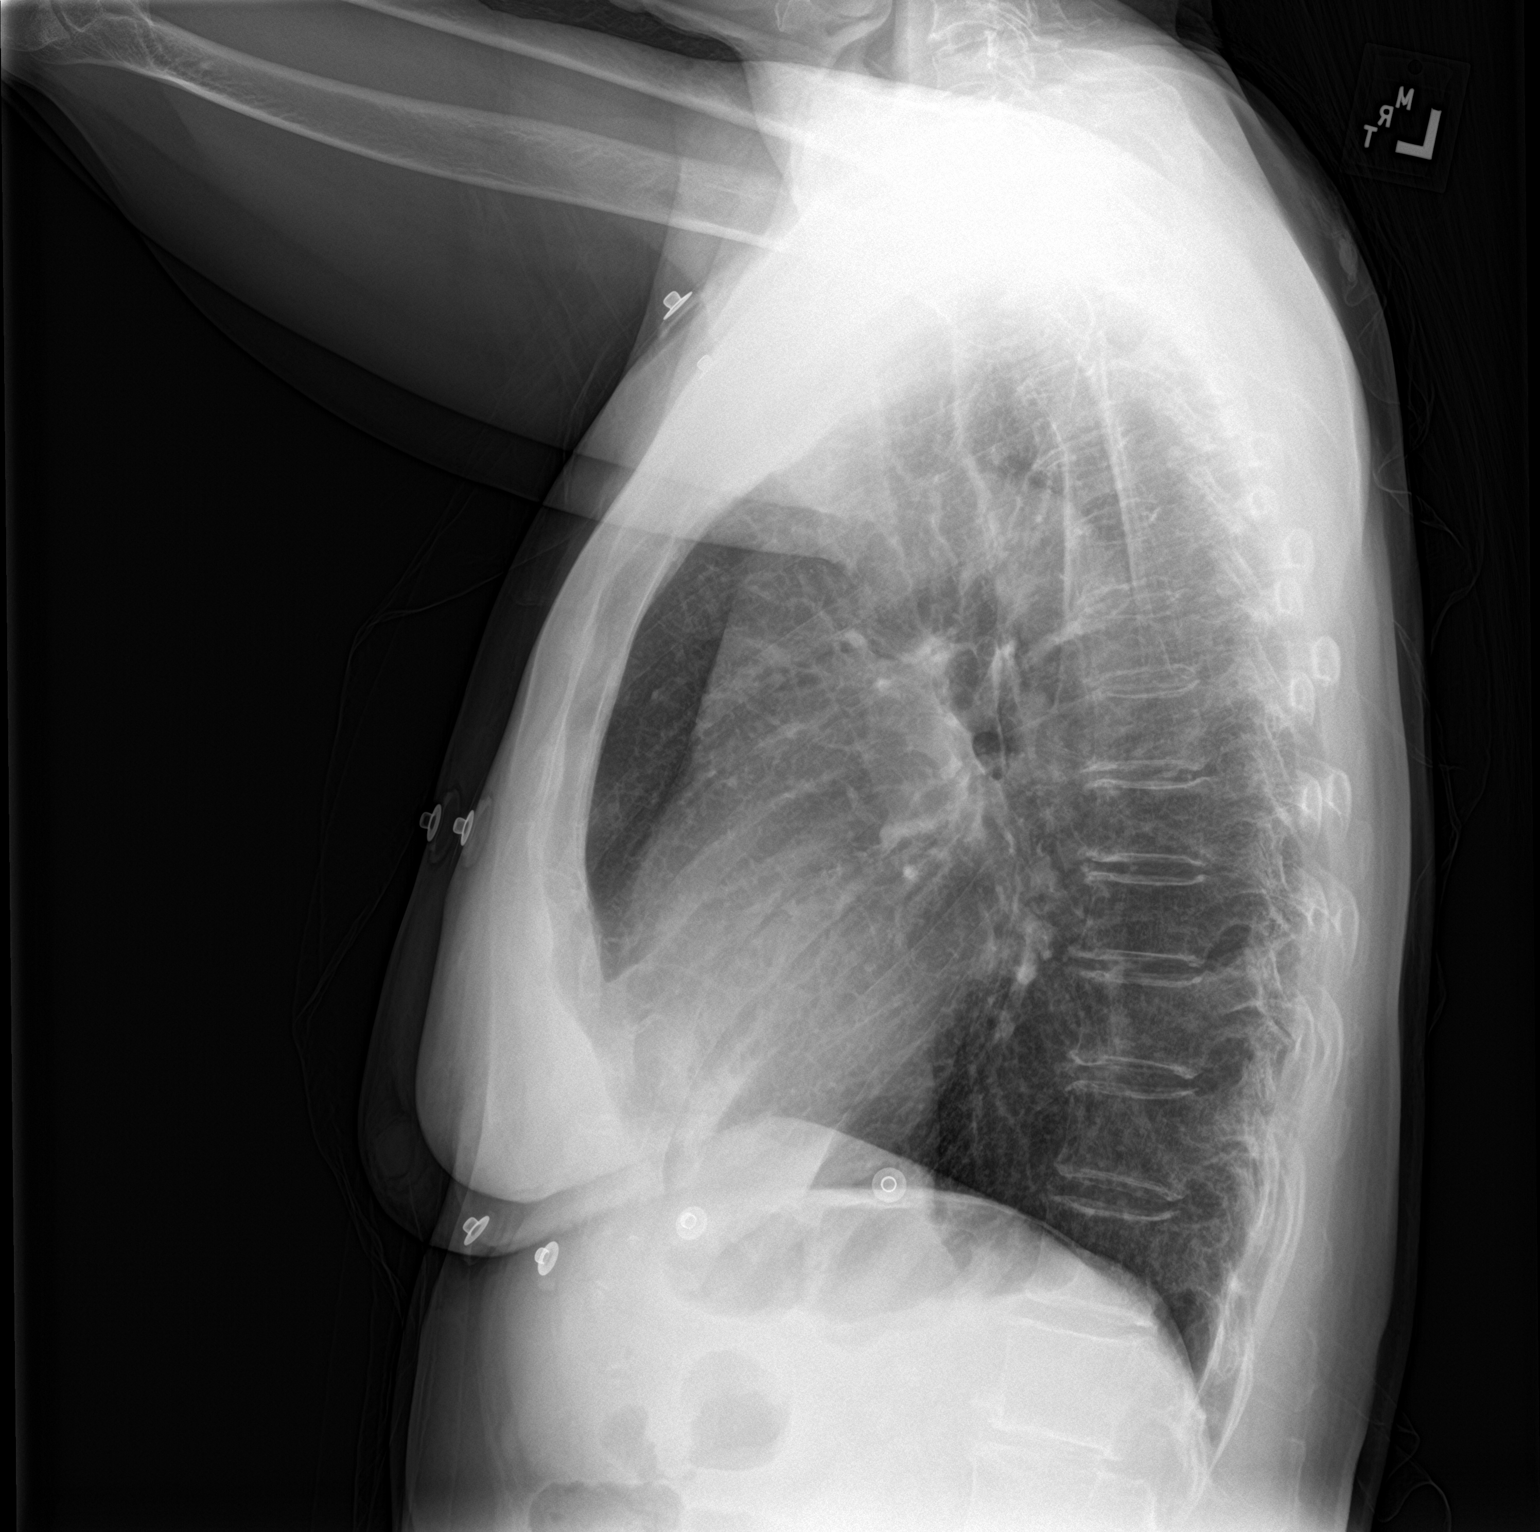

[2 of 2 positions shown; findings below may reference images not displayed]

FINDINGS: The cardiomediastinal contours are normal. The lungs are clear.
Pulmonary vasculature is normal. No consolidation, pleural effusion,
or pneumothorax. No acute osseous abnormalities are seen.
IMPRESSION: No acute chest findings.

## 2020-09-05 NOTE — ED Triage Notes (Signed)
Pt here d/t pt LOC. Pt was sitting on couch, with sudden onset of chest pressure radiating to back, jaw and left shoulder. Pt became SOB. Pt stood up and had syncope episode.

## 2020-09-05 NOTE — ED Provider Notes (Signed)
Emergency Medicine Provider Triage Evaluation Note  Heather Mills , a 70 y.o. female  was evaluated in triage.  Pt complains of episode of sudden onset left-sided chest pain that radiated to the jaw and left shoulder with associated shortness of breath.  Patient states she used it up and syncopized.  Presents to the ED at this time with her daughter at the bedside with some residual left-sided chest pain and headache.  No recent travel, prolonged immobilization, or surgical procedure.  She is not anticoagulated.  Review of Systems  Positive: Syncope, CP, SOB, headache Negative: Nausea, vomiting, diarrhea  Physical Exam  BP 100/69 (BP Location: Right Arm)   Pulse 68   Temp 98.9 F (37.2 C) (Oral)   Resp 16   Ht 5\' 2"  (1.575 m)   Wt 54.4 kg   SpO2 97%   BMI 21.95 kg/m  Gen:   Awake, no distress   Resp:  Normal effort  MSK:   Moves extremities without difficulty  Other:  RRR no M/R/G.  Lungs CTA B.  Medical Decision Making  Medically screening exam initiated at 5:35 PM.  Appropriate orders placed.  Heather Mills was informed that the remainder of the evaluation will be completed by another provider, this initial triage assessment does not replace that evaluation, and the importance of remaining in the ED until their evaluation is complete.  This chart was dictated using voice recognition software, Dragon. Despite the best efforts of this provider to proofread and correct errors, errors may still occur which can change documentation meaning.     Dondra Spry 09/05/20 1737    11/05/20, MD 09/05/20 1744

## 2020-09-06 ENCOUNTER — Emergency Department (HOSPITAL_COMMUNITY): Payer: Medicare Other

## 2020-09-06 DIAGNOSIS — R55 Syncope and collapse: Secondary | ICD-10-CM | POA: Diagnosis not present

## 2020-09-06 IMAGING — CT CT HEAD W/O CM
4 series · 16 of 47 positions shown, 18 images · non-contrast
Comparison: None.

CLINICAL DATA: Recent syncopal episode

EXAM:
CT HEAD WITHOUT CONTRAST
CT CERVICAL SPINE WITHOUT CONTRAST
TECHNIQUE: Multidetector CT imaging of the head and cervical spine was
performed following the standard protocol without intravenous
contrast. Multiplanar CT image reconstructions of the cervical spine
were also generated.

[Series 3: head without · axial · non-contrast · 0.43mm/px · z∈[+1347,+1467]mm · 7 of 33 slices shown, 9 images]
[im 5/33  brain]
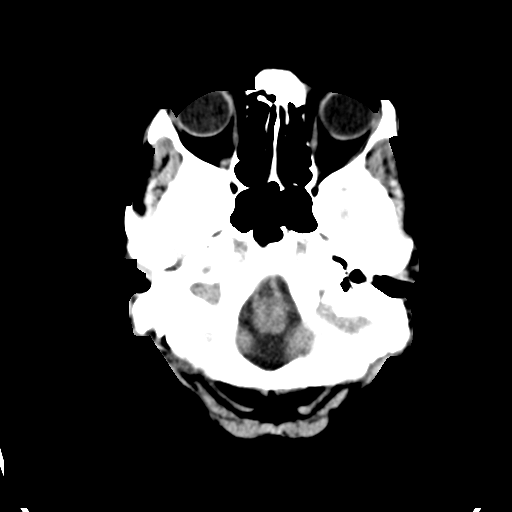
[im 5/33  bone]
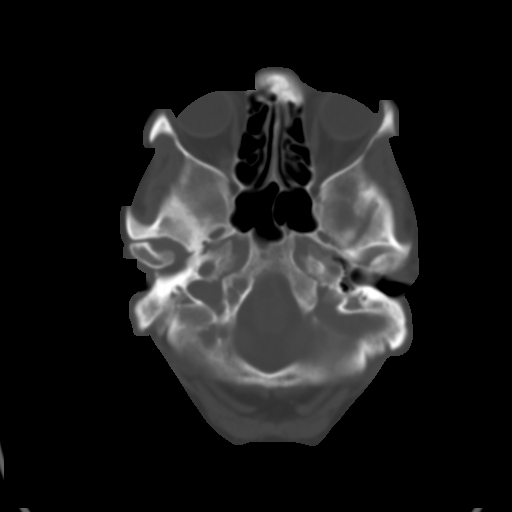
[im 9/33  brain]
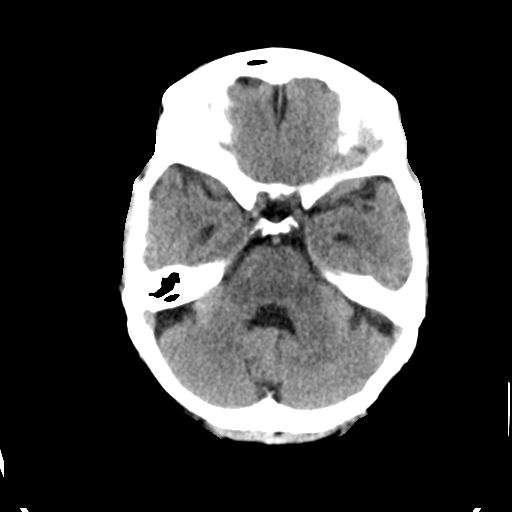
[im 13/33  brain]
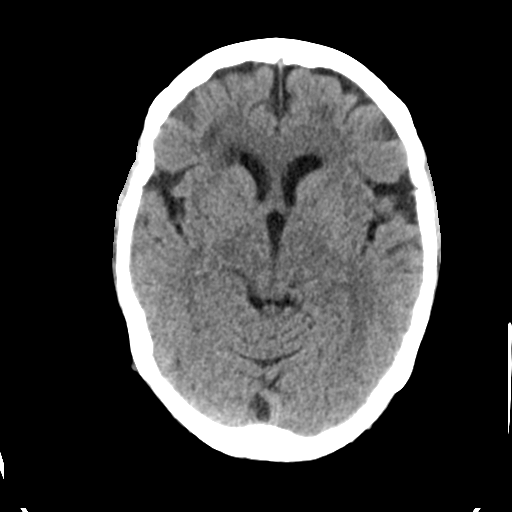
[im 17/33  brain]
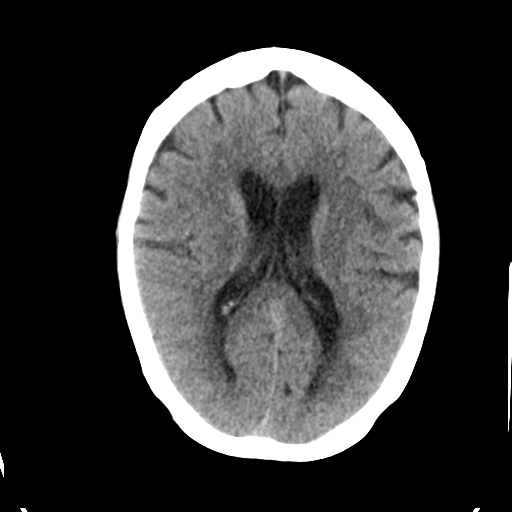
[im 21/33  brain]
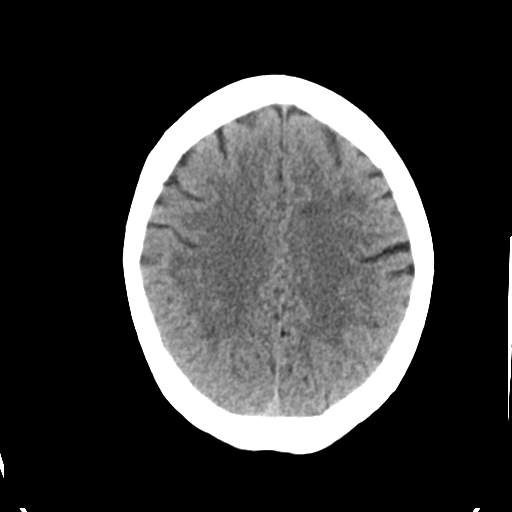
[im 21/33  bone]
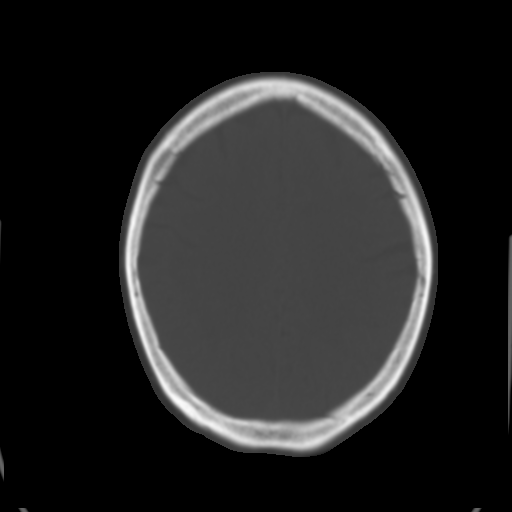
[im 25/33  brain]
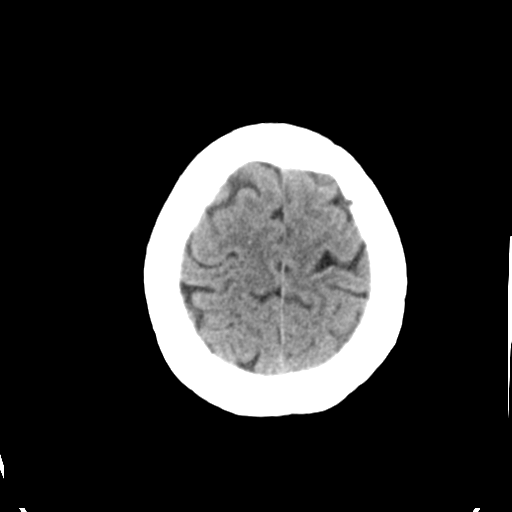
[im 29/33  brain]
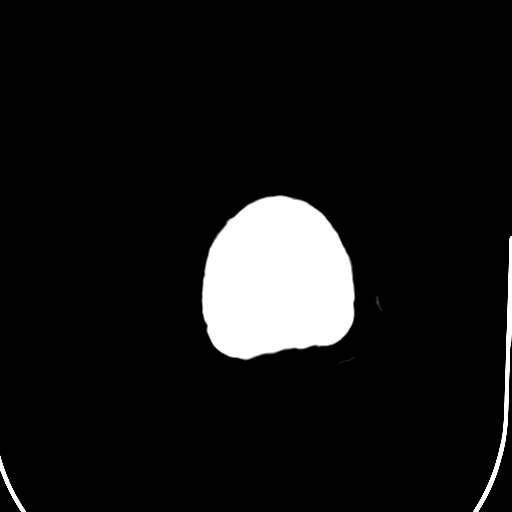

[Series 4: head bone · axial · 0.43mm/px · z∈[+1343,+1375]mm · 3 of 81 slices shown]
[im 9/81  bone]
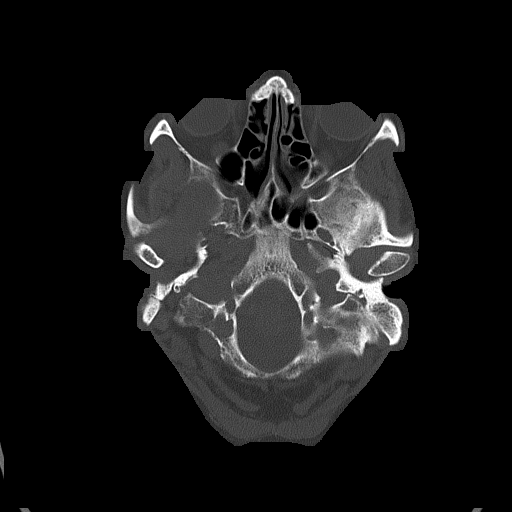
[im 17/81  bone]
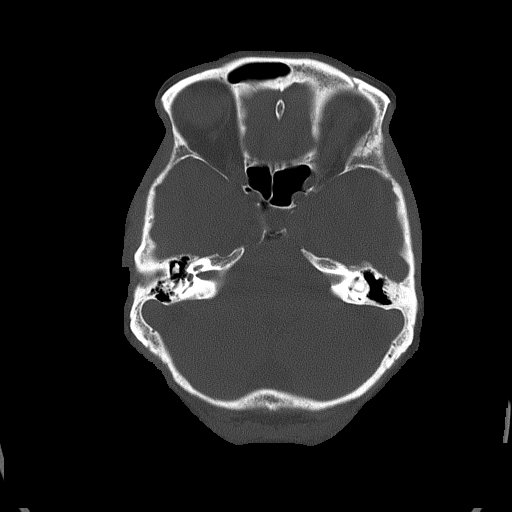
[im 25/81  bone]
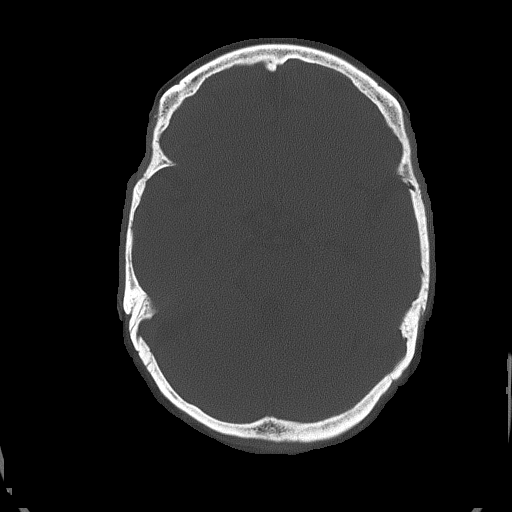

[Series 5: head without cor · coronal · non-contrast · 0.35mm/px · 3 of 58 slices shown]
[im 20/58  brain]
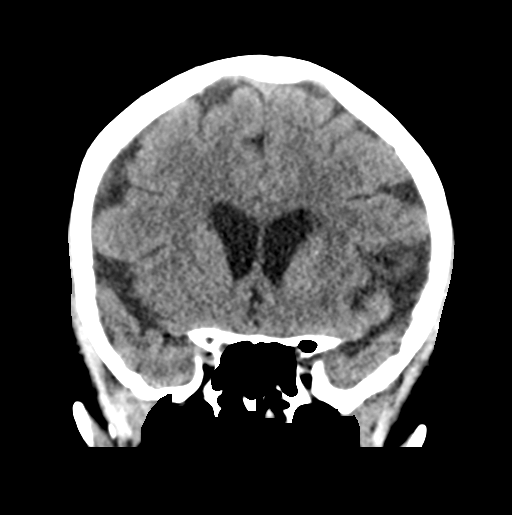
[im 26/58  brain]
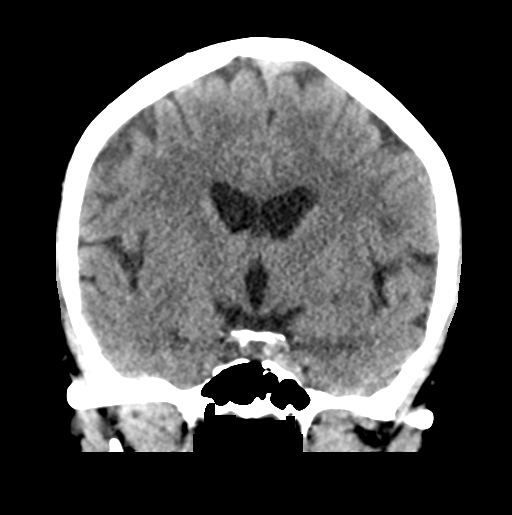
[im 32/58  brain]
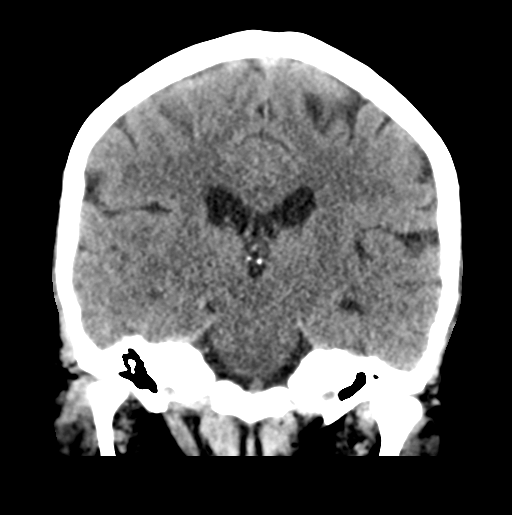

[Series 6: head without sag · sagittal · non-contrast · 0.34mm/px · 3 of 44 slices shown]
[im 15/44  brain]
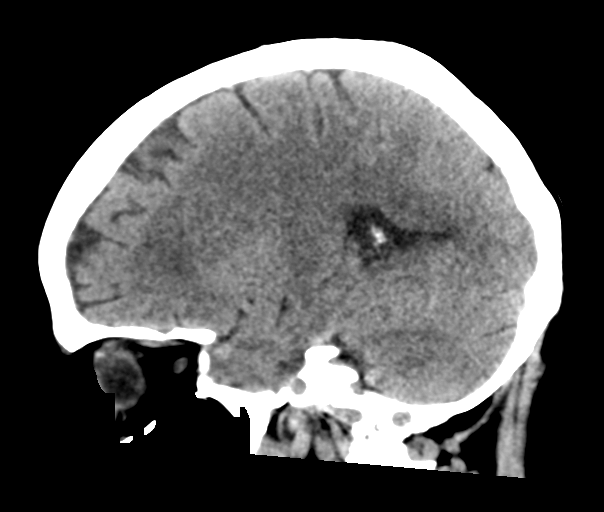
[im 22/44  brain]
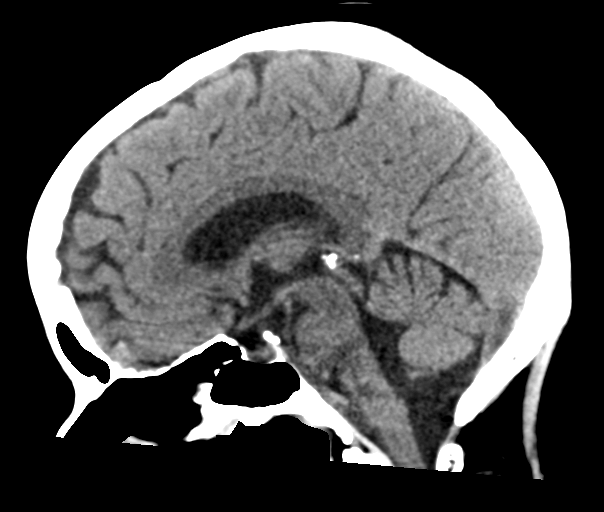
[im 29/44  brain]
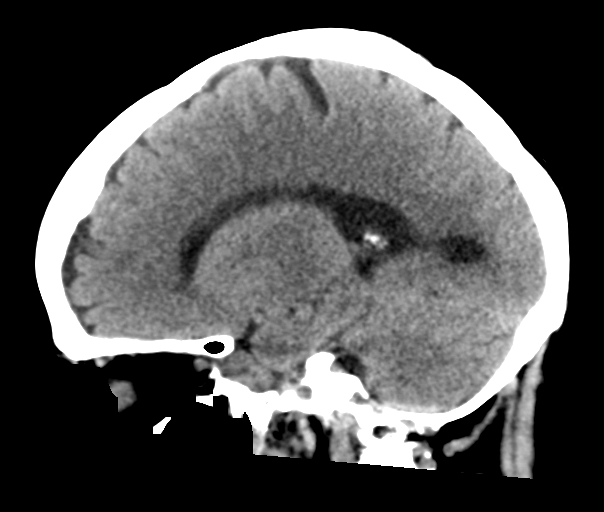

[16 of 47 positions shown; findings below may reference images not displayed]

FINDINGS: CT HEAD FINDINGS

Brain: No evidence of acute infarction, hemorrhage, hydrocephalus,
extra-axial collection or mass lesion/mass effect. Mild chronic
white matter ischemic changes are noted.

Vascular: No hyperdense vessel or unexpected calcification.

Skull: Normal. Negative for fracture or focal lesion.

Sinuses/Orbits: No acute finding.

Other: None.

CT CERVICAL SPINE FINDINGS

Alignment: Loss of normal cervical lordosis is noted.

Skull base and vertebrae: 7 cervical segments are well visualized.
Vertebral body height is well maintained. Multilevel osteophytic
changes and facet hypertrophic changes are seen. No acute fracture
or acute facet abnormality is noted.

Soft tissues and spinal canal: Surrounding soft tissue structures
are within normal limits.

Upper chest: Visualized lung apices are unremarkable.

Other: None
IMPRESSION: CT of the head: Chronic white matter ischemic changes without acute
abnormality.

CT of the cervical spine: Multilevel degenerative change without
acute bony abnormality.

Changes suspicious for muscular spasm with straightening of the
normal cervical lordosis.

## 2020-09-06 MED ORDER — METOCLOPRAMIDE HCL 5 MG/ML IJ SOLN
10.0000 mg | Freq: Once | INTRAMUSCULAR | Status: AC
  Administered 2020-09-06: 10 mg via INTRAVENOUS
  Filled 2020-09-06: qty 2

## 2020-09-06 MED ORDER — DIPHENHYDRAMINE HCL 50 MG/ML IJ SOLN
12.5000 mg | Freq: Once | INTRAMUSCULAR | Status: AC
  Administered 2020-09-06: 12.5 mg via INTRAVENOUS
  Filled 2020-09-06: qty 1

## 2020-09-06 NOTE — ED Provider Notes (Signed)
[facility] EMERGENCY DEPARTMENT Provider Note   CSN: [id number] Arrival date & time: 09/05/20  1518     History Chief Complaint  Patient presents with   Loss of Consciousness    Heather Mills is a 70 y.o. female.  Patient to ED with daughter who reports that symptoms started this morning with fatigue after waking up. She laid down on the couch and slept. When she woke she describes sudden onset of sharp, severe left chest pain that radiated to her back, left shoulder, posterior left neck and had bilateral frontal headache. She stood up and passed out briefly before her husband came into the room. She currently has an ongoing frontal headache and mild chest and neck pain. No vomiting, recent illness or fever.   The history is provided by the patient and a relative. A language interpreter was used (Daughter at bedside).  Loss of Consciousness Associated symptoms: chest pain and headaches   Associated symptoms: no fever and no nausea       Past Medical History:  Diagnosis Date   Dyslipidemia     Patient Active Problem List   Diagnosis Date Noted   Gastroesophageal reflux disease 03/31/2020   Adjustment disorder with mixed anxiety and depressed mood 04/02/2018   Rheumatoid arthritis involving multiple sites with positive rheumatoid factor (HCC) 12/15/2015   Hyperlipidemia 12/09/2015   Need for influenza vaccination 12/09/2015   Need for vaccination with 13-polyvalent pneumococcal conjugate vaccine 12/09/2015   Pure hypercholesterolemia 12/09/2015   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   OB History   No obstetric history on file.     Family History  Adopted: Yes  Problem Relation Age of Onset   Heart block Mother    Heart attack Father     Social History   Tobacco Use   Smoking status: Never   Smokeless tobacco: Never    Home Medications Prior to Admission medications   Medication Sig Start Date End Date Taking? Authorizing Provider  atorvastatin (LIPITOR) 40 MG tablet  10/09/19   [provider]  calcium acetate (PHOSLO) 667 MG capsule Take 1,334 mg by mouth.    [provider]  gabapentin (NEURONTIN) 100 MG capsule Take 1 capsule (100 mg total) by mouth at bedtime. 03/05/17   Hyatt, Max T, DPM  Glucosamine HCl (SM GLUCOSAMINE HCL) 1500 MG TABS Take by mouth.    [provider]  hydroxychloroquine (PLAQUENIL) 200 MG tablet Take 300 mg by mouth. 09/06/15   [provider]  Multiple Vitamin (MULTIVITAMIN) capsule Take by mouth.    [provider]    Allergies    Doxycycline and Oxycodone-acetaminophen  Review of Systems   Review of Systems  Constitutional:  Negative for chills and fever.  HENT: Negative.    Eyes:  Negative for visual disturbance.  Respiratory: Negative.    Cardiovascular:  Positive for chest pain  and syncope.  Gastrointestinal: Negative.  Negative for abdominal pain and nausea.  Musculoskeletal:  Positive for neck pain.  Skin: Negative.   Neurological:  Positive for syncope and headaches.   Physical Exam Updated Vital Signs BP 110/65 (BP Location: Right Arm)   Pulse 64   Temp 98.5 F (36.9 C) (Oral)   Resp 16   Ht  (1.575 m)   Wt 54.4 kg   SpO2 98%   BMI 21.95 kg/m   Physical Exam Vitals and nursing note reviewed.  Constitutional:      Appearance: She is  well-developed.  HENT:     Head: Normocephalic.  Cardiovascular:     Rate and Rhythm: Normal rate and regular rhythm.     Heart sounds: No murmur heard. Pulmonary:     Effort: Pulmonary effort is normal.     Breath sounds: Normal breath sounds. No wheezing, rhonchi or rales.  Abdominal:     General: Bowel sounds are normal.     Palpations: Abdomen is soft.     Tenderness: There is no abdominal tenderness. There is no guarding or rebound.  Musculoskeletal:        General: Normal range of motion.     Cervical back: Normal range of motion and neck supple.  Skin:    General: Skin is warm and dry.  Neurological:     General: No focal deficit present.     Mental Status: She is alert and oriented to person, place, and time.     Sensory: No sensory deficit.     Motor: No weakness.     Coordination: Coordination normal.    ED Results / Procedures / Treatments   Labs (all labs ordered are listed, but only abnormal results are displayed) Labs Reviewed  CBC - Abnormal; Notable for the following components:      Result Value   WBC 11.4 (*)    MCV 102.4 (*)    All other components within normal limits  BASIC METABOLIC PANEL  MAGNESIUM  TROPONIN I (HIGH SENSITIVITY)  TROPONIN I (HIGH SENSITIVITY)    EKG EKG Interpretation  Date/Time:  Monday September 05 2020 16:47:48 EDT Ventricular Rate:  73 PR Interval:  160 QRS Duration: 72 QT Interval:  392 QTC Calculation: 431 R Axis:   15 Text Interpretation: Normal sinus rhythm Cannot rule out Anterior infarct , age undetermined Abnormal ECG No ST elevations or depressions, Confirmed by Kommor, Madison ([id number]) on 09/05/2020 5:43:14 PM  Radiology DG Chest 2 View  Result Date: 09/05/2020 CLINICAL DATA:  Chest pain and shortness of breath. EXAM: CHEST - 2 VIEW COMPARISON:  None. FINDINGS: The cardiomediastinal contours are normal. The lungs are clear. Pulmonary vasculature is normal. No consolidation, pleural effusion, or pneumothorax. No acute  osseous abnormalities are seen. IMPRESSION: No acute chest findings. Electronically Signed   By: Narda Rutherford M.D.   On: 09/05/2020 19:28    Procedures Procedures   Medications Ordered in ED Medications - No data to display  ED Course  I have reviewed the triage vital signs and the nursing notes.  Pertinent labs & imaging results that were available during my care of the patient were reviewed by me and considered in my medical decision making (see chart for details).    MDM Rules/Calculators/A&P                           Patient to ED with ss/sxs as per HPI.   Her  exam shows no neurologic deficits, no arrhythmias, no evidence of infection or dehydration. CT head and neck negative for bleed, or injury. EKG NSR. CXR clear  She has neck and chest wall tenderness c/w MSK pain. Syncopal unexplained but does not appear cardiogenic, infectious or neurologic. Recommend follow up with primary care. Daughter requesting referral to cardiology which will be provided.   Return precautions discussed.   Final Clinical Impression(s) / ED Diagnoses Final diagnoses:  None   Syncope Chest wall pain Frontal headache  Rx / DC Orders ED Discharge Orders     None        Danne Harbor 09/06/20 0306    Dione Booze, MD 09/06/20 [phone]

## 2020-09-06 NOTE — Discharge Instructions (Addendum)
Follow up with your doctor for recheck of current symptoms this week. You can call and make an appointment with cardiology for further outpatient evaluation of syncope as well.   Return to the ED with any new or concerning symptoms at any time.

## 2020-09-08 NOTE — Progress Notes (Signed)
Cardiology Office Note:   Date:  09/09/2020  NAME:  Heather Mills    MRN: [id number] DOB:  November 01, 1950   PCP:  Eartha Inch, MD  Cardiologist:  None  Electrophysiologist:  None   Referring MD: Sherrie Mustache, MD   Chief Complaint  Patient presents with   Chest Pain    History of Present Illness:   Heather Mills is a 70 y.o. female with a hx of RA, HLD who is being seen today for the evaluation of chest pain at the request of Eartha Inch, MD. Seen in ER 09/05/2020 for chest pain and syncope. Troponin negative x 2. EKG normal. A CT of the head neck was obtained.  The CT showed chronic white matter changes.  No acute stroke.  She also had changes noted in the cervical muscle area consistent with muscular spasm. She presents with her daughter.  Apparently she had a syncopal episode as well as chest pain on 09/05/2020.  ER visit summarized as above.  Apparently around 2:00 in the afternoon she got up from laying on the couch.  She developed intense pain in her chest as well as into her neck.  Described as sharp pain.  Also went into her back.  She then passed out.  She reports she may have been dizzy.  She came to quickly.  No residual deficits.  No seizure-like activity.  She did not urinate or defecate on herself.  She was seen in the emergency room with negative work-up.  She continues to have episodes of sharp pain.  She is tender with palpation in the left chest area.  Her EKG is normal.  Her medical history is significant for rheumatoid arthritis as well as hyperlipidemia.  She takes Lipitor as well as aspirin.  She has never had a heart attack or stroke.  She does not exercise due to COVID.  Before this she was going and doing water aerobics with no issues.  Her pain is not worsened by exertion or alleviated by rest.  It appears to be constant.  She is a never smoker.  She does not drink alcohol or use drugs.  She reports she may drink 1 bottle of water per day.  She does drink tea and coffee  and other items but does not like drinking water.  She had only eaten breakfast that morning and had not eaten lunch.  She apparently eats lunch around 4 PM.  She has 4 children.  Several grandchildren.  She is currently not working.  They are quite alarmed by her symptoms.  They want to know that it is not her heart.  I did counsel him extensively that it is likely not.  Problem List RA HLD  Past Medical History: Past Medical History:  Diagnosis Date   Dyslipidemia    Hyperlipidemia    Rheumatoid arthritis (HCC)     Past Surgical History: History reviewed. No pertinent surgical history.  Current Medications: Current Meds  Medication Sig   acetaminophen (TYLENOL) 325 MG tablet Take 650 mg by mouth every 6 (six) hours as needed for moderate pain or headache.   aspirin EC 81 MG tablet Take 81 mg by mouth daily. Swallow whole.   atorvastatin (LIPITOR) 40 MG tablet Take 40 mg by mouth daily.   calcium acetate (PHOSLO) 667 MG capsule Take 1,334 mg by mouth 3 (three) times daily with meals.   FLUoxetine (PROZAC) 20 MG capsule Take 20 mg by mouth daily.   Glucosamine HCl 1500 MG  TABS Take 1,500 mg by mouth daily.   hydroxychloroquine (PLAQUENIL) 200 MG tablet Take 300 mg by mouth daily.   ibuprofen (ADVIL) 800 MG tablet Take 1 tablet (800 mg total) by mouth 3 (three) times daily.   metoprolol tartrate (LOPRESSOR) 100 MG tablet Take 1 tablet by mouth once for procedure.   Multiple Vitamin (MULTIVITAMIN) capsule Take 1 capsule by mouth daily.   naproxen sodium (ALEVE) 220 MG tablet Take 220 mg by mouth daily as needed (pain).   Omega-3 Fatty Acids (FISH OIL) 1000 MG CPDR Take 1,000 mg by mouth daily.     Allergies:    Doxycycline and Oxycodone-acetaminophen   Social History: Social History   Socioeconomic History   Marital status: Divorced    Spouse name: Not on file   Number of children: 4   Years of education: Not on file   Highest education level: Not on file  Occupational  History   Not on file  Tobacco Use   Smoking status: Never   Smokeless tobacco: Never  Substance and Sexual Activity   Alcohol use: Never   Drug use: Never   Sexual activity: Not on file  Other Topics Concern   Not on file  Social History Narrative   Not on file   Social Determinants of Health   Financial Resource Strain: Not on file  Food Insecurity: Not on file  Transportation Needs: Not on file  Physical Activity: Not on file  Stress: Not on file  Social Connections: Not on file     Family History: The patient's family history includes Cancer in her father. She was adopted.  ROS:   All other ROS reviewed and negative. Pertinent positives noted in the HPI.     EKGs/Labs/Other Studies Reviewed:   The following studies were personally reviewed by me today:  EKG:  EKG is ordered today.  The ekg ordered today demonstrates normal sinus rhythm heart rate 64, no acute ischemic changes, no evidence of infarction, and was personally reviewed by me.   Recent Labs: 09/05/2020: BUN 15; Creatinine, Ser 0.80; Hemoglobin 13.0; Magnesium 2.2; Platelets 224; Potassium 3.9; Sodium 139   Recent Lipid Panel No results found for: CHOL, TRIG, HDL, CHOLHDL, VLDL, LDLCALC, LDLDIRECT  Physical Exam:   VS:  BP 116/64   Pulse 64   Ht 5\' 2"  (1.575 m)   Wt 129 lb (58.5 kg)   SpO2 96%   BMI 23.59 kg/m    Wt Readings from Last 3 Encounters:  09/09/20 129 lb (58.5 kg)  09/05/20 120 lb (54.4 kg)  07/11/16 133 lb (60.3 kg)    General: Well nourished, well developed, in no acute distress Head: Atraumatic, normal size  Eyes: PEERLA, EOMI  Neck: Supple, no JVD Endocrine: No thryomegaly Cardiac: Normal S1, S2; RRR; no murmurs, rubs, or gallops Lungs: Clear to auscultation bilaterally, no wheezing, rhonchi or rales  Abd: Soft, nontender, no hepatomegaly  Ext: No edema, pulses 2+ Musculoskeletal: No deformities, BUE and BLE strength normal and equal Skin: Warm and dry, no rashes   Neuro:  Alert and oriented to person, place, time, and situation, CNII-XII grossly intact, no focal deficits  Psych: Normal mood and affect   ASSESSMENT:   [name] is a 70 y.o. female who presents for the following: 1. Chest pain, unspecified type   2. Syncope and collapse   3. Dehydration   4. Neck pain   5. Muscle strain   6. Precordial pain    PLAN:   1.  Chest pain, unspecified type -Likely noncardiac chest pain.  She reports sharp intense pain that is worse with palpation.  Her CT scan of her neck shows evidence of a cervical strain.  I suspect this is just musculoskeletal in nature.  EKG is normal.  Cardiovascular examination is normal.  Troponins were negative in the ER.  She really has no real risk factors for heart disease.  Her symptoms continue to be persistent.  They have concerned that she may be having a heart attack.  I have informed her that she has not.  To further rule out coronary disease we will proceed with a coronary CTA.  She will take 100 mg of metoprolol tartrate to before the scan.  I have recommended ibuprofen for the muscle strain.  We will see how she does.  2. Syncope and collapse -Suspect noncardiac.  Likely vasovagal triggered by pain.  She reports intense pain in her chest and neck that likely triggered the episode.  She only drinks 1 bottle of water per day.  I suspect this contributed.  I have recommended adequate hydration.  3. Dehydration -Suspect symptoms are related to dehydration.  Recommended that she drink plenty of water.  4. Neck pain 5. Muscle strain -Overall suspect this to be musculoskeletal.  I recommended ibuprofen 800 mg 3 times daily for 7 days.  She needs to drink plenty of water with this.  We will rule out coronary disease with a CT scan.  6. Precordial pain -See above.  Disposition: Return if symptoms worsen or fail to improve.  Medication Adjustments/Labs and Tests Ordered: Current medicines are reviewed at length with the patient  today.  Concerns regarding medicines are outlined above.  Orders Placed This Encounter  Procedures   CT CORONARY MORPH W/CTA COR W/SCORE W/CA W/CM &/OR WO/CM   EKG 12-Lead    Meds ordered this encounter  Medications   metoprolol tartrate (LOPRESSOR) 100 MG tablet    Sig: Take 1 tablet by mouth once for procedure.    Dispense:  1 tablet    Refill:  0   ibuprofen (ADVIL) 800 MG tablet    Sig: Take 1 tablet (800 mg total) by mouth 3 (three) times daily.    Dispense:  21 tablet    Refill:  0     Patient Instructions  Medication Instructions:  Take Ibuprofen 800 mg three times daily for 7 days. (Drink 4-6 bottles of water a day with this)   Take Metoprolol 100 mg two hours before CT when scheduled.   *If you need a refill on your cardiac medications before your next appointment, please call your pharmacy*   Testing/Procedures: Your physician has requested that you have cardiac CT. Cardiac computed tomography (CT) is a painless test that uses an x-ray machine to take clear, detailed pictures of your heart. For further information please visit [email or web address] Please follow instruction sheet as given.   Follow-Up: At [facility], you and your health needs are our priority.  As part of our continuing mission to provide you with exceptional heart care, we have created designated Provider Care Teams.  These Care Teams include your primary Cardiologist (physician) and Advanced Practice Providers (APPs -  Physician Assistants and Nurse Practitioners) who all work together to provide you with the care you need, when you need it.  We recommend signing up for the patient portal called "MyChart".  Sign up information is provided on this After Visit Summary.  MyChart is used to connect with patients for  Virtual Visits (Telemedicine).  Patients are able to view lab/test results, encounter notes, upcoming appointments, etc.  Non-urgent messages can be sent to your provider as well.   To learn  more about what you can do with MyChart, go to [email or web address].    Your next appointment:   As needed (based on results)  The format for your next appointment:   In Person  Provider:   Lennie Odor, MD     Your cardiac CT will be scheduled at one of the below locations:   [facility] [address] [place], [place] [address] [phone]  If scheduled at [facility], please arrive at the [facility] main entrance (entrance A) of [facility] 30 minutes prior to test start time. Proceed to the [facility] Radiology Department (first floor) to check-in and test prep.  Please follow these instructions carefully (unless otherwise directed):   On the Night Before the Test: Be sure to Drink plenty of water. Do not consume any caffeinated/decaffeinated beverages or chocolate 12 hours prior to your test. Do not take any antihistamines 12 hours prior to your test.  On the Day of the Test: Drink plenty of water until 1 hour prior to the test. Do not eat any food 4 hours prior to the test. You may take your regular medications prior to the test.  Take metoprolol (Lopressor) two hours prior to test. HOLD Furosemide/Hydrochlorothiazide morning of the test. FEMALES- please wear underwire-free bra if available, avoid dresses & tight clothing      After the Test: Drink plenty of water. After receiving IV contrast, you may experience a mild flushed feeling. This is normal. On occasion, you may experience a mild rash up to 24 hours after the test. This is not dangerous. If this occurs, you can take Benadryl 25 mg and increase your fluid intake. If you experience trouble breathing, this can be serious. If it is severe call 911 IMMEDIATELY. If it is mild, please call our office. If you take any of these medications: Glipizide/Metformin, Avandament, Glucavance, please do not take 48 hours after completing test unless otherwise instructed.  Please allow  2-4 weeks for scheduling of routine cardiac CTs. Some insurance companies require a pre-authorization which may delay scheduling of this test.   For non-scheduling related questions, please contact the cardiac imaging nurse navigator should you have any questions/concerns: Rockwell Alexandria, Cardiac Imaging Nurse Navigator Larey Brick, Cardiac Imaging Nurse Navigator [facility] Direct Office Dial: [phone]   For scheduling needs, including cancellations and rescheduling, please call [place], [phone].    Signed, Lenna Gilford. Flora Lipps, MD, [facility]  [facility]  [address] [place], [place] [address] [phone]  09/09/2020 10:18 AM

## 2020-09-09 ENCOUNTER — Other Ambulatory Visit: Payer: Self-pay

## 2020-09-09 ENCOUNTER — Encounter: Payer: Self-pay | Admitting: Cardiovascular Disease

## 2020-09-09 ENCOUNTER — Ambulatory Visit (INDEPENDENT_AMBULATORY_CARE_PROVIDER_SITE_OTHER): Payer: Medicare Other | Admitting: Cardiovascular Disease

## 2020-09-09 VITALS — BP 116/64 | HR 64 | Ht 62.0 in | Wt 129.0 lb

## 2020-09-09 DIAGNOSIS — E860 Dehydration: Secondary | ICD-10-CM

## 2020-09-09 DIAGNOSIS — M542 Cervicalgia: Secondary | ICD-10-CM

## 2020-09-09 DIAGNOSIS — R55 Syncope and collapse: Secondary | ICD-10-CM

## 2020-09-09 DIAGNOSIS — T148XXA Other injury of unspecified body region, initial encounter: Secondary | ICD-10-CM

## 2020-09-09 DIAGNOSIS — R079 Chest pain, unspecified: Secondary | ICD-10-CM

## 2020-09-09 DIAGNOSIS — R072 Precordial pain: Secondary | ICD-10-CM

## 2020-09-09 MED ORDER — IBUPROFEN 800 MG PO TABS: 800.0000 mg | ORAL_TABLET | Freq: Three times a day (TID) | ORAL | 0 refills | Status: AC

## 2020-09-09 MED ORDER — METOPROLOL TARTRATE 100 MG PO TABS: ORAL_TABLET | ORAL | 0 refills | Status: DC

## 2020-09-09 NOTE — Patient Instructions (Addendum)
Medication Instructions:  Take Ibuprofen 800 mg three times daily for 7 days. (Drink 4-6 bottles of water a day with this)   Take Metoprolol 100 mg two hours before CT when scheduled.   *If you need a refill on your cardiac medications before your next appointment, please call your pharmacy*   Testing/Procedures: Your physician has requested that you have cardiac CT. Cardiac computed tomography (CT) is a painless test that uses an x-ray machine to take clear, detailed pictures of your heart. For further information please visit [email or web address] Please follow instruction sheet as given.   Follow-Up: At [facility], you and your health needs are our priority.  As part of our continuing mission to provide you with exceptional heart care, we have created designated Provider Care Teams.  These Care Teams include your primary Cardiologist (physician) and Advanced Practice Providers (APPs -  Physician Assistants and Nurse Practitioners) who all work together to provide you with the care you need, when you need it.  We recommend signing up for the patient portal called "MyChart".  Sign up information is provided on this After Visit Summary.  MyChart is used to connect with patients for Virtual Visits (Telemedicine).  Patients are able to view lab/test results, encounter notes, upcoming appointments, etc.  Non-urgent messages can be sent to your provider as well.   To learn more about what you can do with MyChart, go to [email or web address].    Your next appointment:   As needed (based on results)  The format for your next appointment:   In Person  Provider:   Lennie Odor, MD     Your cardiac CT will be scheduled at one of the below locations:   [facility] [address] [place], [place] [address] [phone]  If scheduled at [facility], please arrive at the [facility] main entrance (entrance A) of [facility] 30 minutes prior to test start  time. Proceed to the [facility] Radiology Department (first floor) to check-in and test prep.  Please follow these instructions carefully (unless otherwise directed):   On the Night Before the Test: Be sure to Drink plenty of water. Do not consume any caffeinated/decaffeinated beverages or chocolate 12 hours prior to your test. Do not take any antihistamines 12 hours prior to your test.  On the Day of the Test: Drink plenty of water until 1 hour prior to the test. Do not eat any food 4 hours prior to the test. You may take your regular medications prior to the test.  Take metoprolol (Lopressor) two hours prior to test. HOLD Furosemide/Hydrochlorothiazide morning of the test. FEMALES- please wear underwire-free bra if available, avoid dresses & tight clothing      After the Test: Drink plenty of water. After receiving IV contrast, you may experience a mild flushed feeling. This is normal. On occasion, you may experience a mild rash up to 24 hours after the test. This is not dangerous. If this occurs, you can take Benadryl 25 mg and increase your fluid intake. If you experience trouble breathing, this can be serious. If it is severe call 911 IMMEDIATELY. If it is mild, please call our office. If you take any of these medications: Glipizide/Metformin, Avandament, Glucavance, please do not take 48 hours after completing test unless otherwise instructed.  Please allow 2-4 weeks for scheduling of routine cardiac CTs. Some insurance companies require a pre-authorization which may delay scheduling of this test.   For non-scheduling related questions, please contact the  cardiac imaging nurse navigator should you have any questions/concerns: Rockwell Alexandria, Cardiac Imaging Nurse Navigator Larey Brick, Cardiac Imaging Nurse Navigator [facility] Direct Office Dial: [phone]   For scheduling needs, including cancellations and rescheduling, please call [place],  [phone].

## 2020-09-19 ENCOUNTER — Telehealth: Payer: Self-pay | Admitting: Cardiovascular Disease

## 2020-09-19 ENCOUNTER — Telehealth (HOSPITAL_COMMUNITY): Payer: Self-pay | Admitting: Emergency Medicine

## 2020-09-19 NOTE — Telephone Encounter (Signed)
Spoke with daughter, DPR on file.  Made her aware that the Coronary CT will look for blockages/narrowing in the arteries so see if that is what is causing his CP.  Daughter appreciative for call.

## 2020-09-19 NOTE — Telephone Encounter (Signed)
Patient's daughter calling to speak with a nurse about the patient's CT test scheduled for Wednesday. She would like to know if the test will check for a blockage or just a clot.

## 2020-09-19 NOTE — Telephone Encounter (Signed)
Reaching out to patient to offer assistance regarding upcoming cardiac imaging study; pt verbalizes understanding of appt date/time, parking situation and where to check in, pre-test NPO status and medications ordered, and verified current allergies; name and call back number provided for further questions should they arise Zema Lizardo RN Navigator Cardiac Imaging [facility] [phone] office [phone] cell   Denies claustro Denies iv issues 100mg metoprolol tart  

## 2020-09-21 ENCOUNTER — Ambulatory Visit (HOSPITAL_COMMUNITY)
Admission: RE | Admit: 2020-09-21 | Discharge: 2020-09-21 | Disposition: A | Discharge status: Home / Self Care | Payer: Medicare Other | Source: Ambulatory Visit | Attending: Cardiology | Admitting: Cardiology

## 2020-09-21 ENCOUNTER — Other Ambulatory Visit: Payer: Self-pay

## 2020-09-21 ENCOUNTER — Ambulatory Visit (HOSPITAL_COMMUNITY)
Admission: RE | Admit: 2020-09-21 | Discharge: 2020-09-21 | Disposition: A | Discharge status: Home / Self Care | Payer: Medicare Other | Source: Ambulatory Visit | Attending: Cardiovascular Disease | Admitting: Cardiovascular Disease

## 2020-09-21 ENCOUNTER — Other Ambulatory Visit (HOSPITAL_COMMUNITY): Payer: Self-pay | Admitting: Emergency Medicine

## 2020-09-21 ENCOUNTER — Encounter (HOSPITAL_COMMUNITY): Payer: Self-pay

## 2020-09-21 DIAGNOSIS — R072 Precordial pain: Secondary | ICD-10-CM | POA: Diagnosis present

## 2020-09-21 DIAGNOSIS — R931 Abnormal findings on diagnostic imaging of heart and coronary circulation: Secondary | ICD-10-CM | POA: Diagnosis present

## 2020-09-21 DIAGNOSIS — R079 Chest pain, unspecified: Secondary | ICD-10-CM | POA: Diagnosis present

## 2020-09-21 DIAGNOSIS — I2510 Atherosclerotic heart disease of native coronary artery without angina pectoris: Secondary | ICD-10-CM | POA: Diagnosis not present

## 2020-09-21 IMAGING — CT CT HEART MORP W/ CTA COR W/ SCORE W/ CA W/CM &/OR W/O CM
4 of 7 series · 8 of 20 positions shown, 9 images · IV contrast (APPLIED)
Comparison: None.
COMPARISON: None.

Addendum:
EXAM:
OVER-READ INTERPRETATION  CT CHEST

The following report is an over-read performed by radiologist Dr.
NIN WALAN [REDACTED] on [DATE]. This
over-read does not include interpretation of cardiac or coronary
anatomy or pathology. The coronary calcium score/coronary CTA
interpretation by the cardiologist is attached.
CLINICAL DATA: Chest pain
Cardiac CTA
MEDICATIONS:
Sub lingual nitro. 4mg x 2
TECHNIQUE: The patient was scanned on a Siemens [REDACTED]ice scanner. Gantry
rotation speed was 250 msecs. Collimation was 0.6 mm. A 100 kV
prospective scan was triggered in the ascending thoracic aorta at
35-75% of the R-R interval. Average HR during the scan was 60 bpm.
The 3D data set was interpreted on a dedicated work station using
MPR, MIP and VRT modes. A total of 80cc of contrast was used.

[Series 6: best diast · axial · 0.39mm/px · z∈[+1028,+1068]mm · 2 of 298 slices shown, 3 images]
[im 100/298  vessel]
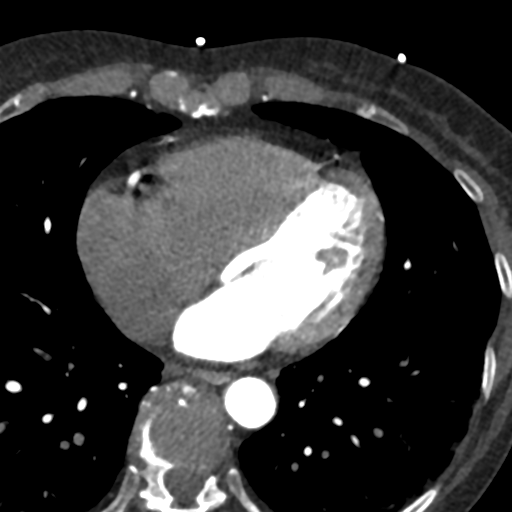
[im 100/298  lung]
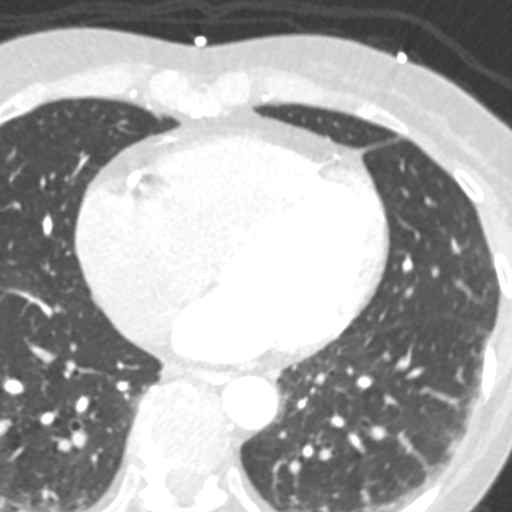
[im 199/298  vessel]
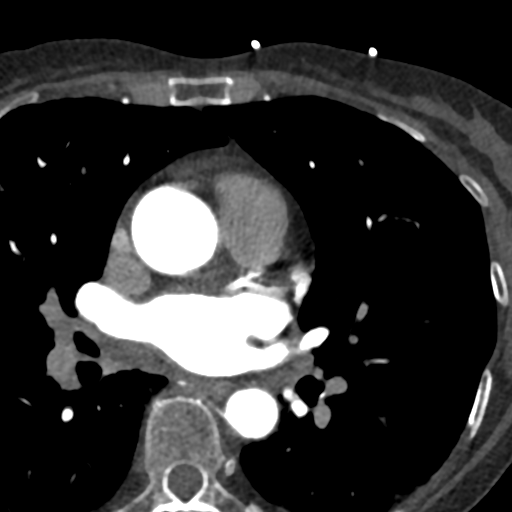

[Series 7: best syst · axial · 0.39mm/px · z∈[+1028,+1068]mm · 2 of 298 slices shown]
[im 100/298  vessel]
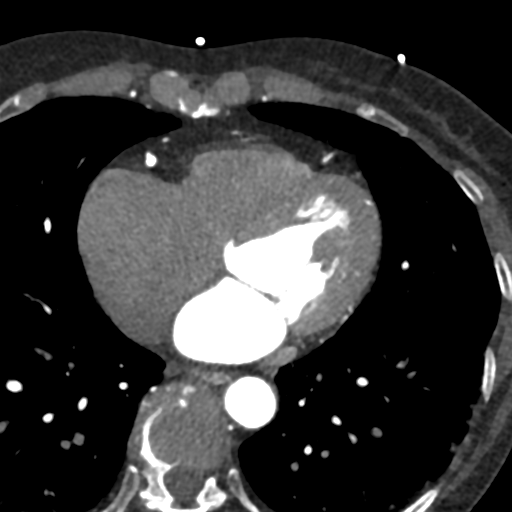
[im 199/298  vessel]
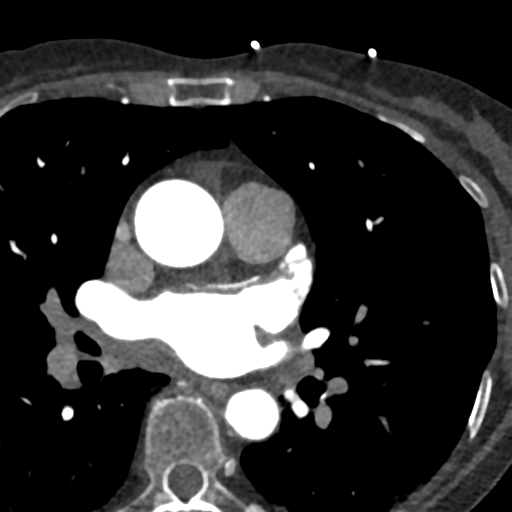

[Series 10: ts diast sharp · axial · 0.39mm/px · z∈[+1028,+1068]mm · 2 of 298 slices shown]
[im 100/298  lung]
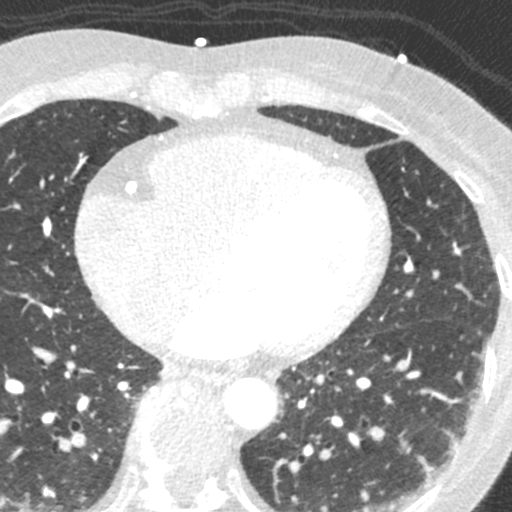
[im 199/298  lung]
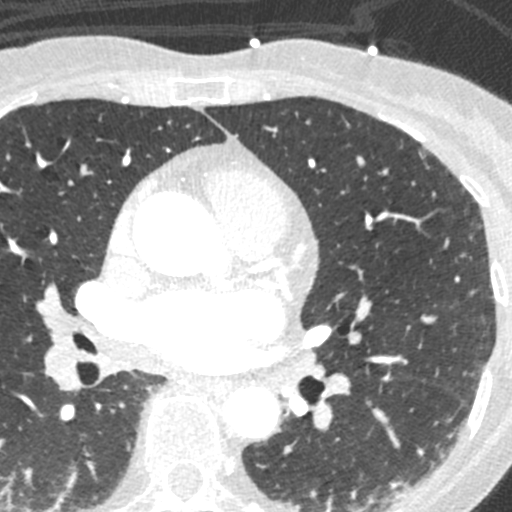

[Series 11: ts syst sharp · axial · 0.39mm/px · z∈[+1028,+1068]mm · 2 of 298 slices shown]
[im 100/298  lung]
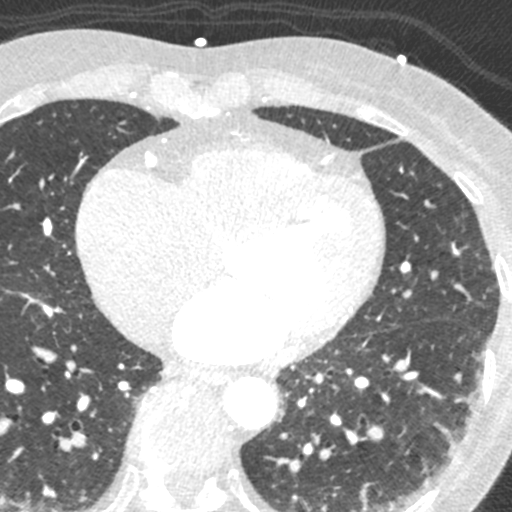
[im 199/298  lung]
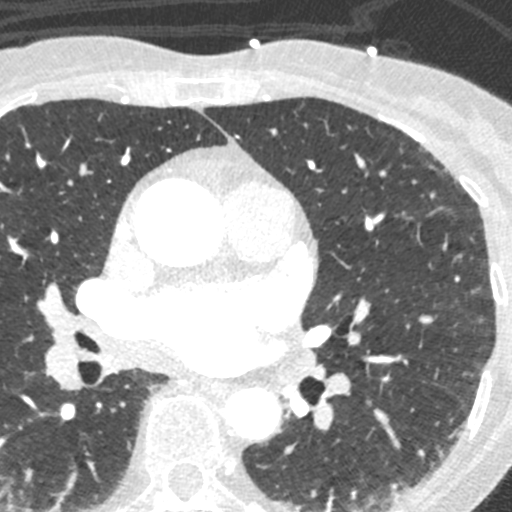

[8 of 20 positions shown; findings below may reference images not displayed]

FINDINGS: Tiny calcified granuloma in the right middle lobe. Atherosclerotic
calcifications in the thoracic aorta. Within the visualized portions
of the thorax there are no suspicious appearing pulmonary nodules or
masses, there is no acute consolidative airspace disease, no pleural
effusions, no pneumothorax and no lymphadenopathy. In the upper
abdomen there is an incompletely imaged low-attenuation lesion in
the left lobe of the liver measuring at least 8.8 x 5.8 cm which has
an internal septation, incompletely characterized. There are no
aggressive appearing lytic or blastic lesions noted in the
visualized portions of the skeleton.
IMPRESSION: 1. Large incompletely imaged cystic lesion with apparent internal
septation in the left lobe of the liver. This warrants further
characterization with nonemergent abdominal MRI with and without IV
gadolinium to provide definitive characterization of this lesion.
FINDINGS: Non-cardiac: See separate report from [REDACTED].

Pulmonary veins drain normally to the left atrium. No LA appendage
thrombus. Cannot rule out small PFO.

Calcium Score: 216 Agatston units.

Coronary Arteries: Right dominant with no anomalies

LM: Mixed plaque distal left main, mild (<50%) stenosis.

LAD system: Mixed plaque proximal LAD, mild (<50%) stenosis. Mid LAD
tapering with soft plaque, there appears to be about 50% stenosis.

Circumflex system: No plaque or stenosis.

RCA system: No plaque or stenosis.
IMPRESSION: 1. Coronary artery calcium score 216 Agatston units. This places the
patient in the 55th percentile for age and gender, suggesting
intermediate risk for future cardiac events.

2. Extensive coronary disease in the proximal to mid LAD, there
appears to be about 50% stenosis in the mid LAD. Will send for FFR
to confirm.

NIN WALAN

*** End of Addendum ***
EXAM:
OVER-READ INTERPRETATION  CT CHEST

The following report is an over-read performed by radiologist Dr.
NIN WALAN [REDACTED] on [DATE]. This
over-read does not include interpretation of cardiac or coronary
anatomy or pathology. The coronary calcium score/coronary CTA
interpretation by the cardiologist is attached.
FINDINGS: Tiny calcified granuloma in the right middle lobe. Atherosclerotic
calcifications in the thoracic aorta. Within the visualized portions
of the thorax there are no suspicious appearing pulmonary nodules or
masses, there is no acute consolidative airspace disease, no pleural
effusions, no pneumothorax and no lymphadenopathy. In the upper
abdomen there is an incompletely imaged low-attenuation lesion in
the left lobe of the liver measuring at least 8.8 x 5.8 cm which has
an internal septation, incompletely characterized. There are no
aggressive appearing lytic or blastic lesions noted in the
visualized portions of the skeleton.
IMPRESSION: 1. Large incompletely imaged cystic lesion with apparent internal
septation in the left lobe of the liver. This warrants further
characterization with nonemergent abdominal MRI with and without IV
gadolinium to provide definitive characterization of this lesion.

## 2020-09-21 MED ORDER — IOHEXOL 350 MG/ML SOLN
95.0000 mL | Freq: Once | INTRAVENOUS | Status: AC | PRN
  Administered 2020-09-21: 95 mL via INTRAVENOUS

## 2020-09-21 MED ORDER — NITROGLYCERIN 0.4 MG SL SUBL
SUBLINGUAL_TABLET | SUBLINGUAL | Status: AC
  Filled 2020-09-21: qty 1

## 2020-09-21 MED ORDER — NITROGLYCERIN 0.4 MG SL SUBL
0.8000 mg | SUBLINGUAL_TABLET | Freq: Once | SUBLINGUAL | Status: AC
  Administered 2020-09-21: 0.8 mg via SUBLINGUAL

## 2020-09-21 NOTE — Progress Notes (Signed)
CT scan complete. Blood pressure taken, patient denies dizziness. Patient states she has a slight headache but she feels fine. Caffeine beverage offered to patient. Patient declined caffeine beverage/snack and states she will get something at home, daughter aware. Patient daughter aware test complete and slight headache.

## 2020-09-22 ENCOUNTER — Telehealth: Payer: Self-pay | Admitting: Cardiovascular Disease

## 2020-09-22 DIAGNOSIS — I2510 Atherosclerotic heart disease of native coronary artery without angina pectoris: Secondary | ICD-10-CM | POA: Diagnosis not present

## 2020-09-22 NOTE — Telephone Encounter (Signed)
Both pt and daughter are calling to get the pts Cardiac CT results she had done on 8/24. Daughter states the results released to them in [place], but they haven't head back from Dr. Flora Lipps and his nurse with the results. Informed both parties that the pts Cardiac CT results are not completely finalized at this time (reader did send off for FFR), and as soon as this is complete and Dr. Flora Lipps has had a chance to review this, his nurse will call them shortly thereafter to report these results to them. Informed both parties that I will route this message to both Dr. Flora Lipps and his nurse as an Lorain Childes, to make them aware of this. Both pt and daughter verbalized understanding and agrees with this plan. Both were gracious for all the assistance provided.

## 2020-09-22 NOTE — Telephone Encounter (Signed)
Pt is calling in regards to obtaining the most recent results from her CT Cardiac test on 09/21/20

## 2020-09-22 NOTE — Telephone Encounter (Signed)
Liba, Hulsey - 09/22/2020 10:30 AM O'Neal, Ronnald Ramp, MD  Sent: Thu September 22, 2020  3:21 PM  To: Loa Socks, LPN  Cc: Darene Lamer, LPN          Message  I sent a message via My chart that she has non-obstructive CAD. No significant blockages.     Gerri Spore T. Flora Lipps, MD, [facility]  [facility]   [address]  [place], [place] [address]  [phone]   3:21 PM    ----- Message -----  From: Loa Socks, LPN  Sent: 1/50/5697  12:36 PM EDT  To: Sande Rives, MD, Darene Lamer, LPN

## 2020-09-23 NOTE — Telephone Encounter (Signed)
Called and spoke to daughter in regards to results. They verbalized understanding.

## 2020-10-04 ENCOUNTER — Telehealth: Payer: Self-pay | Admitting: Cardiovascular Disease

## 2020-10-04 NOTE — Telephone Encounter (Signed)
Message routed to MD(s) regarding provider switch request

## 2020-10-04 NOTE — Telephone Encounter (Signed)
Daughter is requesting a referral from Dr. Flora Lipps to a specialist that can address the spot on the patient's liver that was found during a recent scan

## 2020-10-04 NOTE — Telephone Encounter (Signed)
Daughter of the patient called. She would like the patient to see Dr. Antoine Poche instead of Dr. Flora Lipps. The patient's husband sees Dr. Antoine Poche and the daughter would like both patients to see the same provider.

## 2020-10-04 NOTE — Telephone Encounter (Signed)
Message sent to [facility] MD/Julie LPN

## 2020-10-05 ENCOUNTER — Other Ambulatory Visit: Payer: Self-pay

## 2020-10-05 DIAGNOSIS — K769 Liver disease, unspecified: Secondary | ICD-10-CM

## 2020-10-05 NOTE — Telephone Encounter (Signed)
Referral sent over.  Thanks!

## 2020-10-16 ENCOUNTER — Encounter: Payer: Self-pay | Admitting: Cardiology

## 2020-10-16 DIAGNOSIS — R072 Precordial pain: Secondary | ICD-10-CM | POA: Insufficient documentation

## 2020-10-16 DIAGNOSIS — R55 Syncope and collapse: Secondary | ICD-10-CM | POA: Insufficient documentation

## 2020-10-16 NOTE — Progress Notes (Signed)
Cardiology Office Note   Date:  10/17/2020   ID:  Heather, Mills Jun 27, 1950, MRN [id number]  PCP:  Eartha Inch, MD  Cardiologist:   None   Chief Complaint  Patient presents with   Chest Pain      History of Present Illness: Heather Mills is a 70 y.o. female who presents for follow up of chest pain.  This is his first visit with me but he has been seen in our practice for follow up of chest pain and syncope.   She was found to have LM less than 50% stenosis, LAD less than 50% stenosis and no plaque in the RCA.  FFR again suggested no obstructive disease.  The event is extensively described in previous note.  This was in August.  There was severe pain going up into her neck and into the back of her head.  She had frank syncope for a brief period.  She has been having headaches since that time.  I did review the ER records.  She had a noncontrasted head CT which was unremarkable.  She is not particularly physically active because of some knee pains and problems after bunionectomy.  She might go to the grocery store.  She has not been describing substernal chest pressure, neck or arm discomfort.  She is not having any new shortness of breath, PND or orthopnea.  She has not had any further syncopal episodes and is not describing any palpitations.  She still has a headache.  She did have some left shoulder discomfort but she actually had injections in this and this is better.   Past Medical History:  Diagnosis Date   Dyslipidemia    Rheumatoid arthritis (HCC)     History reviewed. No pertinent surgical history.   Current Outpatient Medications  Medication Sig Dispense Refill   acetaminophen (TYLENOL) 325 MG tablet Take 650 mg by mouth every 6 (six) hours as needed for moderate pain or headache.     aspirin EC 81 MG tablet Take 81 mg by mouth daily. Swallow whole.     atorvastatin (LIPITOR) 40 MG tablet Take 40 mg by mouth daily.     calcium acetate (PHOSLO) 667 MG capsule  Take 1,334 mg by mouth 3 (three) times daily with meals.     FLUoxetine (PROZAC) 20 MG capsule Take 20 mg by mouth daily.     Glucosamine HCl 1500 MG TABS Take 1,500 mg by mouth daily.     hydroxychloroquine (PLAQUENIL) 200 MG tablet Take 300 mg by mouth daily.     ibuprofen (ADVIL) 800 MG tablet Take 1 tablet (800 mg total) by mouth 3 (three) times daily. 21 tablet 0   Multiple Vitamin (MULTIVITAMIN) capsule Take 1 capsule by mouth daily.     naproxen sodium (ALEVE) 220 MG tablet Take 220 mg by mouth daily as needed (pain).     Omega-3 Fatty Acids (FISH OIL) 1000 MG CPDR Take 1,000 mg by mouth daily.     metoprolol tartrate (LOPRESSOR) 100 MG tablet Take 1 tablet by mouth once for procedure. (Patient not taking: Reported on 10/17/2020) 1 tablet 0   No current facility-administered medications for this visit.    Allergies:   Doxycycline and Oxycodone-acetaminophen    ROS:  Please see the history of present illness.   Otherwise, review of systems are positive for none.   All other systems are reviewed and negative.    PHYSICAL EXAM: VS:  BP 115/62   Pulse 74  Ht 5\' 2"  (1.575 m)   Wt 130 lb 9.6 oz (59.2 kg)   SpO2 98%   BMI 23.89 kg/m  , BMI Body mass index is 23.89 kg/m. GENERAL:  Well appearing NECK:  No jugular venous distention, waveform within normal limits, carotid upstroke brisk and symmetric, no bruits, no thyromegaly LUNGS:  Clear to auscultation bilaterally CHEST:  Unremarkable HEART:  PMI not displaced or sustained,S1 and S2 within normal limits, no S3, no S4, no clicks, no rubs, no murmurs ABD:  Flat, positive bowel sounds normal in frequency in pitch, no bruits, no rebound, no guarding, no midline pulsatile mass, no hepatomegaly, no splenomegaly EXT:  2 plus pulses upper and diminished dorsalis pedis and posterior tibialis bilaterally lower, no edema, no cyanosis no clubbing   EKG:  EKG is not ordered today.    Recent Labs: 09/05/2020: BUN 15; Creatinine, Ser 0.80;  Hemoglobin 13.0; Magnesium 2.2; Platelets 224; Potassium 3.9; Sodium 139    Lipid Panel No results found for: CHOL, TRIG, HDL, CHOLHDL, VLDL, LDLCALC, LDLDIRECT    Wt Readings from Last 3 Encounters:  10/17/20 130 lb 9.6 oz (59.2 kg)  09/09/20 129 lb (58.5 kg)  09/05/20 120 lb (54.4 kg)      Other studies Reviewed: Additional studies/ records that were reviewed today include: Labs, CT and ED records. Review of the above records demonstrates:  Please see elsewhere in the note.     ASSESSMENT AND PLAN:  CHEST PAIN:    She had non obstructive CAD.  I agree that this should be managed aggressively medically particularly in the absence of further symptoms.  I talked about this with the patient and her daughter.  SYNCOPE:   At her previous evaluation this was thought to be vagal.   HA: Given this and the pain she had experienced in August I will refer her to neurology.  DYSLIPIDEMIA: LDL was 82 with an HDL of 81.  She will continue the meds as listed.  LEG PAIN: This is likely musculoskeletal but she does have some reduced pulses.  I will check ABIs.  Current medicines are reviewed at length with the patient today.  The patient does not have concerns regarding medicines.  The following changes have been made:  no change  Labs/ tests ordered today include:   Orders Placed This Encounter  Procedures   Ambulatory referral to Neurology   VAS September ABI WITH/WO TBI      Disposition:   FU with me in six months.     Signed, [place], MD  10/17/2020 12:51 PM    [facility]

## 2020-10-17 ENCOUNTER — Ambulatory Visit: Payer: Medicare Other | Admitting: Cardiology

## 2020-10-17 ENCOUNTER — Ambulatory Visit (INDEPENDENT_AMBULATORY_CARE_PROVIDER_SITE_OTHER): Payer: Medicare Other | Admitting: Cardiology

## 2020-10-17 ENCOUNTER — Encounter: Payer: Self-pay | Admitting: Neurology

## 2020-10-17 ENCOUNTER — Encounter: Payer: Self-pay | Admitting: Cardiology

## 2020-10-17 ENCOUNTER — Other Ambulatory Visit: Payer: Self-pay

## 2020-10-17 VITALS — BP 115/62 | HR 74 | Ht 62.0 in | Wt 130.6 lb

## 2020-10-17 DIAGNOSIS — R072 Precordial pain: Secondary | ICD-10-CM

## 2020-10-17 DIAGNOSIS — E785 Hyperlipidemia, unspecified: Secondary | ICD-10-CM

## 2020-10-17 DIAGNOSIS — M79605 Pain in left leg: Secondary | ICD-10-CM

## 2020-10-17 DIAGNOSIS — M79604 Pain in right leg: Secondary | ICD-10-CM

## 2020-10-17 DIAGNOSIS — R55 Syncope and collapse: Secondary | ICD-10-CM

## 2020-10-17 DIAGNOSIS — R519 Headache, unspecified: Secondary | ICD-10-CM | POA: Diagnosis not present

## 2020-10-17 NOTE — Patient Instructions (Signed)
Medication Instructions:  Your Physician recommend you continue on your current medication as directed.    *If you need a refill on your cardiac medications before your next appointment, please call your pharmacy*   Lab Work: None ordered today   Testing/Procedures: Your physician has requested that you have an ankle brachial index (ABI). During this test an ultrasound and blood pressure cuff are used to evaluate the arteries that supply the arms and legs with blood. Allow thirty minutes for this exam. There are no restrictions or special instructions. [address]   Follow-Up: At [facility], you and your health needs are our priority.  As part of our continuing mission to provide you with exceptional heart care, we have created designated Provider Care Teams.  These Care Teams include your primary Cardiologist (physician) and Advanced Practice Providers (APPs -  Physician Assistants and Nurse Practitioners) who all work together to provide you with the care you need, when you need it.  We recommend signing up for the patient portal called "MyChart".  Sign up information is provided on this After Visit Summary.  MyChart is used to connect with patients for Virtual Visits (Telemedicine).  Patients are able to view lab/test results, encounter notes, upcoming appointments, etc.  Non-urgent messages can be sent to your provider as well.   To learn more about what you can do with MyChart, go to [email or web address].    Your next appointment:   6 month(s)  The format for your next appointment:   In Person  Provider:   Rollene Rotunda, MD

## 2020-10-20 ENCOUNTER — Other Ambulatory Visit: Payer: Self-pay | Admitting: Physician Assistant

## 2020-10-20 DIAGNOSIS — K7689 Other specified diseases of liver: Secondary | ICD-10-CM

## 2020-10-24 ENCOUNTER — Other Ambulatory Visit: Payer: Self-pay | Admitting: Cardiology

## 2020-10-24 DIAGNOSIS — R0989 Other specified symptoms and signs involving the circulatory and respiratory systems: Secondary | ICD-10-CM

## 2020-10-24 DIAGNOSIS — M79604 Pain in right leg: Secondary | ICD-10-CM

## 2020-10-27 ENCOUNTER — Other Ambulatory Visit: Payer: Self-pay

## 2020-10-27 ENCOUNTER — Ambulatory Visit (HOSPITAL_COMMUNITY)
Admission: RE | Admit: 2020-10-27 | Discharge: 2020-10-27 | Disposition: A | Discharge status: Home / Self Care | Payer: Medicare Other | Source: Ambulatory Visit | Attending: Cardiology | Admitting: Cardiology

## 2020-10-27 DIAGNOSIS — M79604 Pain in right leg: Secondary | ICD-10-CM | POA: Insufficient documentation

## 2020-10-27 DIAGNOSIS — M79605 Pain in left leg: Secondary | ICD-10-CM | POA: Insufficient documentation

## 2020-10-27 DIAGNOSIS — R0989 Other specified symptoms and signs involving the circulatory and respiratory systems: Secondary | ICD-10-CM

## 2020-11-06 ENCOUNTER — Ambulatory Visit
Admission: RE | Admit: 2020-11-06 | Discharge: 2020-11-06 | Disposition: A | Discharge status: Home / Self Care | Payer: Medicare Other | Source: Ambulatory Visit | Attending: Physician Assistant | Admitting: Physician Assistant

## 2020-11-06 ENCOUNTER — Other Ambulatory Visit: Payer: Self-pay

## 2020-11-06 DIAGNOSIS — K7689 Other specified diseases of liver: Secondary | ICD-10-CM

## 2020-11-06 IMAGING — MR MR ABDOMEN WO/W CM MRCP
14 of 20 series · 33 of 48 positions shown · IV contrast (11 ML MULTIHANCE)
Comparison: Cardiac CTA on [DATE]

CLINICAL DATA: Complex cystic liver lesion seen on recent cardiac
CTA.

EXAM:
MRI ABDOMEN WITHOUT AND WITH CONTRAST (INCLUDING MRCP)
TECHNIQUE: Multiplanar multisequence MR imaging of the abdomen was performed
both before and after the administration of intravenous contrast.
Heavily T2-weighted images of the biliary and pancreatic ducts were
obtained, and three-dimensional MRCP images were rendered by post
processing.
CONTRAST:  11mL MULTIHANCE GADOBENATE DIMEGLUMINE 529 MG/ML IV SOLN

[Series 3: T2 · coronal · 5.0mm · 1.56mm/px · 2 of 35 slices shown (1 of 3)]
[im 1/35]
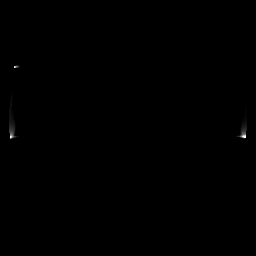
[im 35/35]
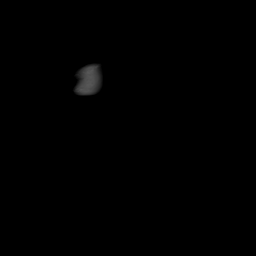

[Series 4: T2 · axial · 6.0mm · 1.48mm/px · z∈[-132,+109]mm · 2 of 36 slices shown (2 of 3)]
[im 1/36]
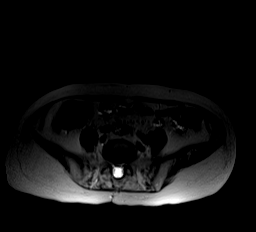
[im 36/36]
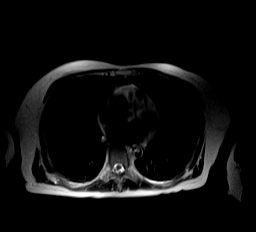

[Series 7: T2 · axial · 6.0mm · 0.74mm/px · 1 of 36 slices shown (3 of 3)]
[im 1/36]
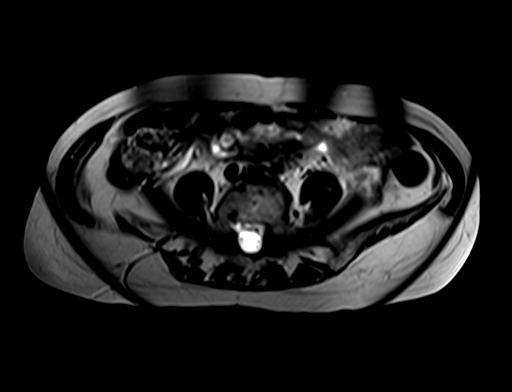

[Series 10: ep2d_diff_b50_500_800_p2-resp · 1 of 5 slices shown]
[im 1/5]
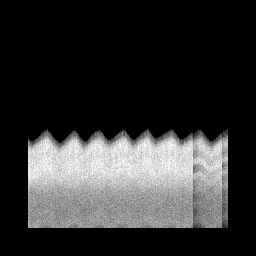

[Series 11: ep2d_diff_b50_500_800_p2 · axial · 6.0mm · 1.88mm/px · z∈[-139,+130]mm · 4 of 120 slices shown]
[im 1/120]
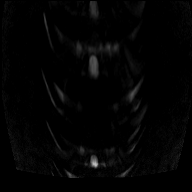
[im 40/120]
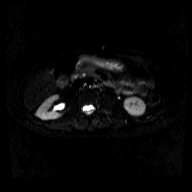
[im 80/120]
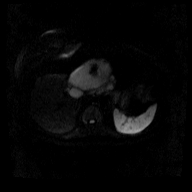
[im 120/120]
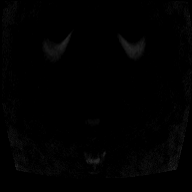

[Series 12: ep2d_diff_b50_500_800_p2_adc · axial · 6.0mm · 1.88mm/px · 1 of 39 slices shown]
[im 1/39]
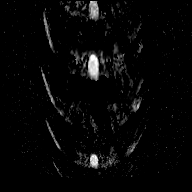

[Series 13: axial tru fisp · axial · 5.0mm · 1.45mm/px · z∈[-146,+106]mm · 2 of 43 slices shown]
[im 1/43]
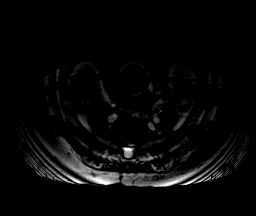
[im 43/43]
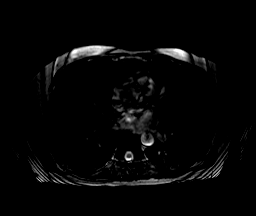

[Series 14: axial in out · axial · 6.0mm · 0.74mm/px · z∈[-137,+97]mm · 3 of 70 slices shown]
[im 1/70]
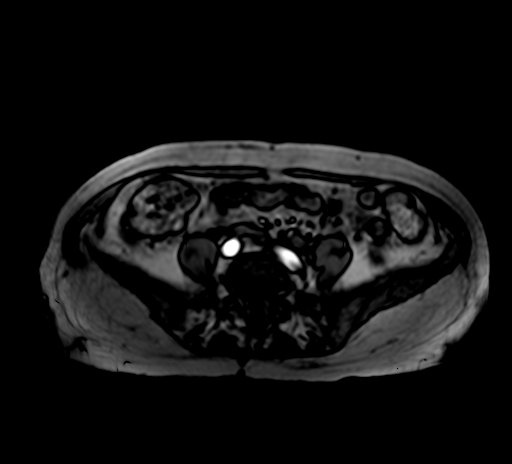
[im 35/70]
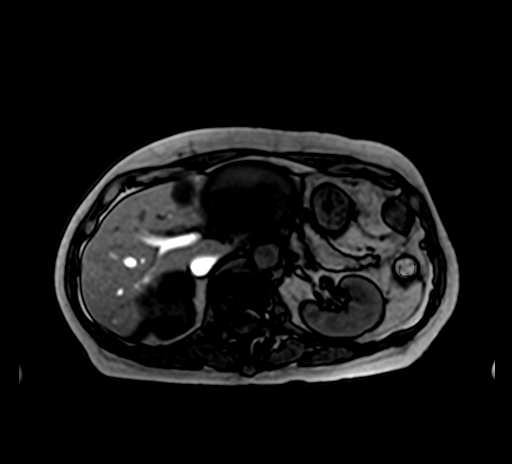
[im 70/70]
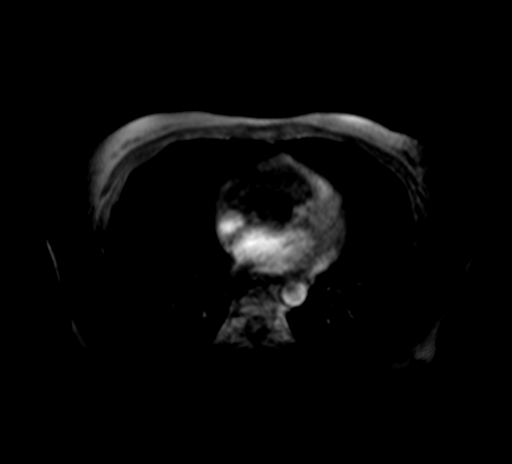

[Series 15: T1 dynamic · axial · non-contrast · 2.5mm · 0.70mm/px · z∈[-129,+89]mm · 3 of 88 slices shown]
[im 1/88]
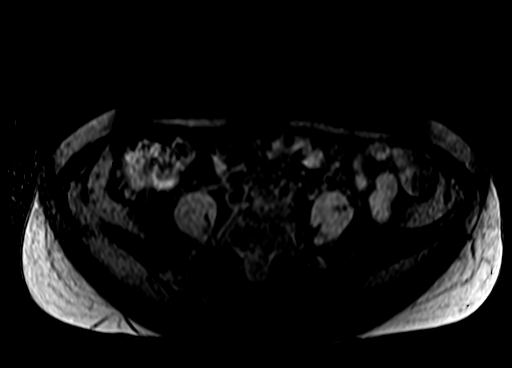
[im 44/88]
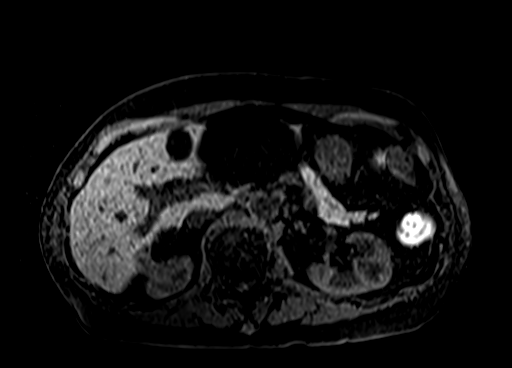
[im 88/88]
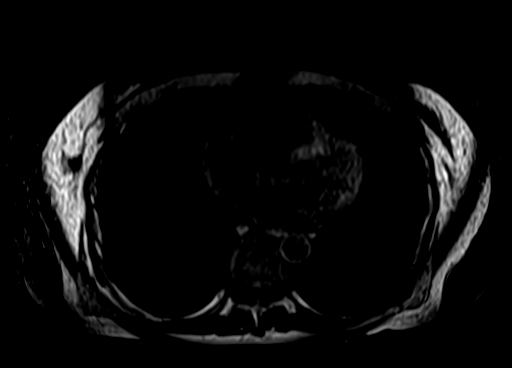

[Series 16: post 25 sec · axial · 2.5mm · 0.70mm/px · z∈[-129,+89]mm · 3 of 88 slices shown]
[im 1/88]
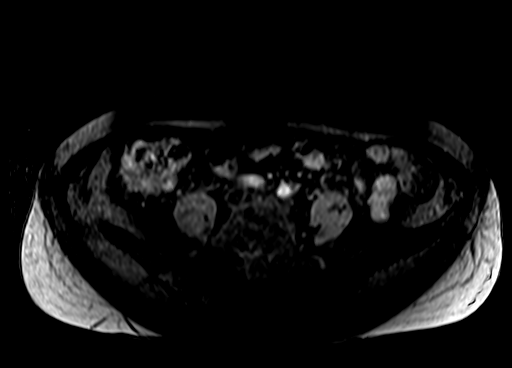
[im 44/88]
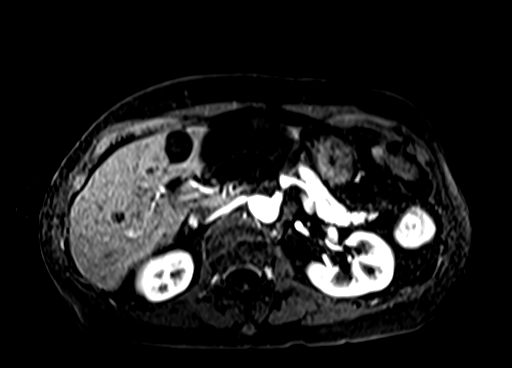
[im 88/88]
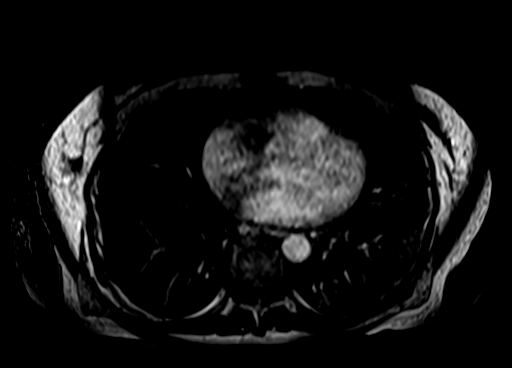

[Series 17: post 25 sec_sub · axial · 2.5mm · 0.70mm/px · z∈[-129,+89]mm · 3 of 88 slices shown]
[im 1/88]
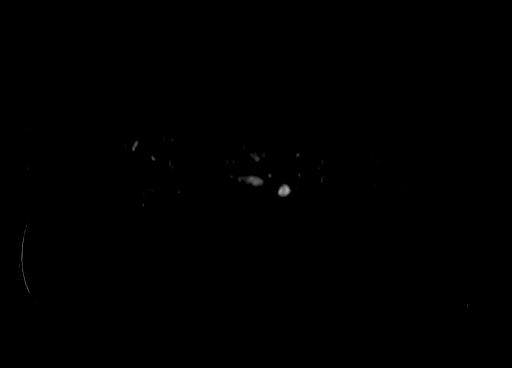
[im 44/88]
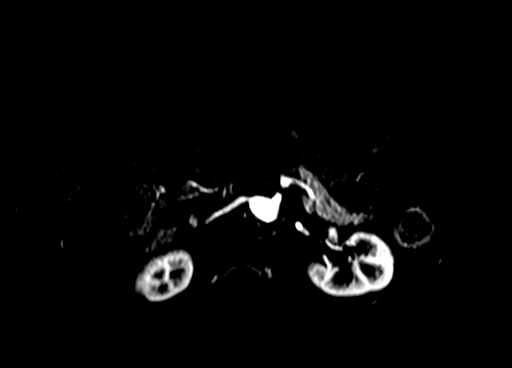
[im 88/88]
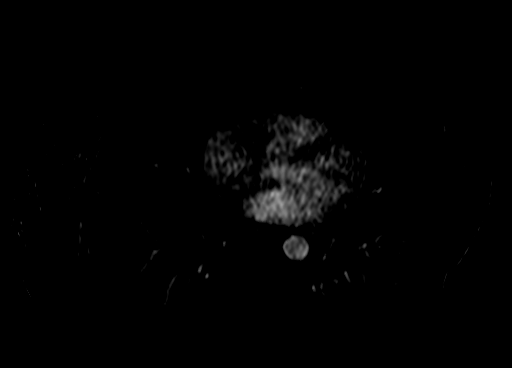

[Series 18: post 45 sec · axial · 2.5mm · 0.70mm/px · z∈[-129,+89]mm · 3 of 88 slices shown]
[im 1/88]
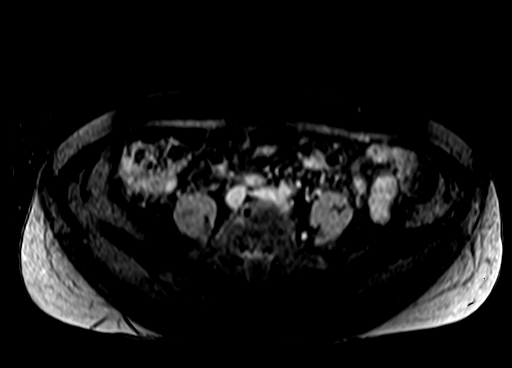
[im 44/88]
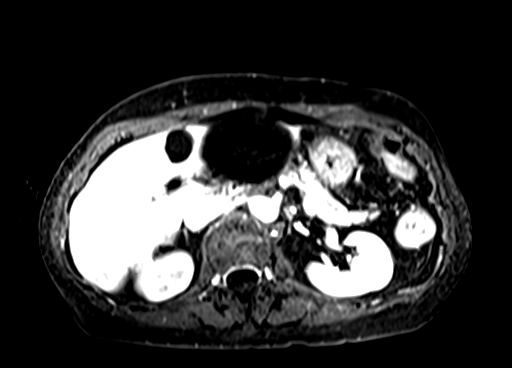
[im 88/88]
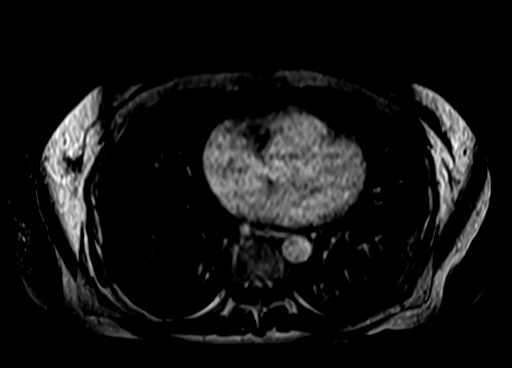

[Series 19: post 45 sec_sub · axial · 2.5mm · 0.70mm/px · z∈[-129,+89]mm · 3 of 88 slices shown]
[im 1/88]
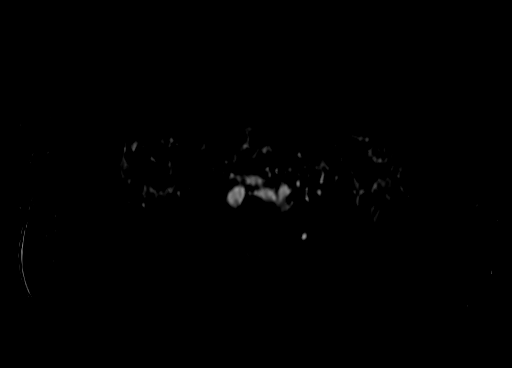
[im 44/88]
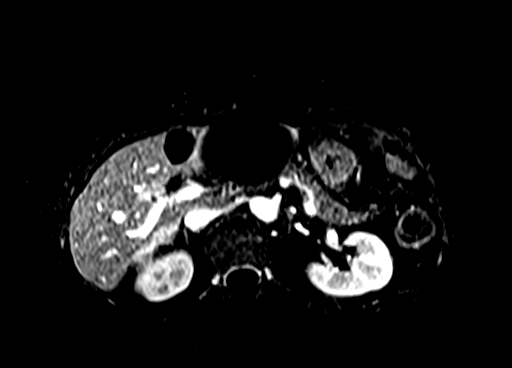
[im 88/88]
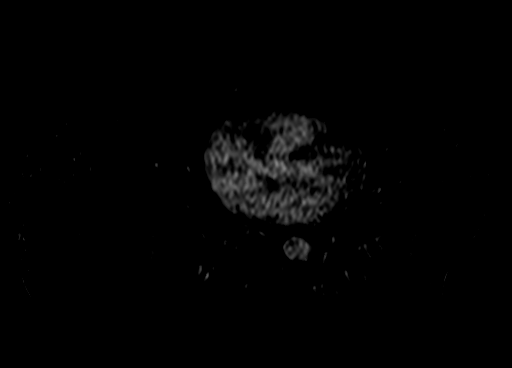

[Series 20: post 90 sec · axial · 2.5mm · 0.70mm/px · z∈[-129,-21]mm · 2 of 88 slices shown]
[im 1/88]
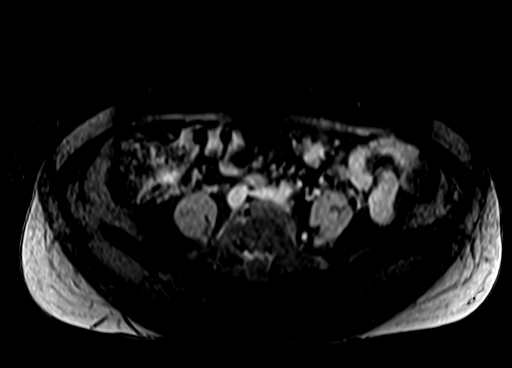
[im 44/88]
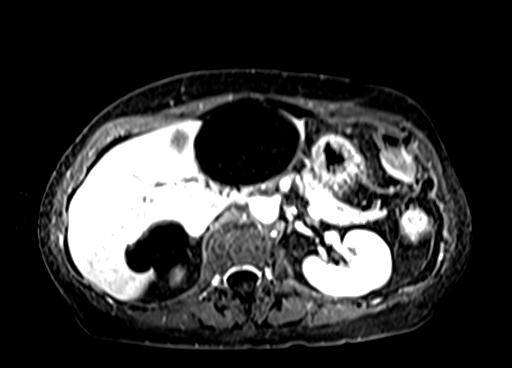

[33 of 48 positions shown; findings below may reference images not displayed]

FINDINGS: Lower chest: No acute findings.

Hepatobiliary: Multiple hepatic cysts are seen throughout the liver.
The 2 largest are located in the left hepatic lobe, measuring 10.1 x
6.8 cm on image [DATE], and in the medial right hepatic lobe measuring
4 6 x 6 cm on image [DATE]. These larger lesions show a few thin
internal septations, however there is no evidence thickened
septations mural nodularity or soft tissue component. These are
consistent with benign hepatic cysts. Prior cholecystectomy. No
evidence of biliary obstruction.

Pancreas:  No mass or inflammatory changes.

Spleen:  Within normal limits in size and appearance.

Adrenals/Urinary Tract: No masses identified. Tiny sub-cm right
renal cyst is noted. No evidence of hydronephrosis.

Stomach/Bowel: Visualized portion unremarkable.

Vascular/Lymphatic: No pathologically enlarged lymph nodes
identified. No acute vascular findings.

Other:  None.

Musculoskeletal:  No suspicious bone lesions identified.
IMPRESSION: Multiple benign-appearing hepatic cysts, largest measuring 10.1 cm.
No evidence of malignancy.

## 2020-11-06 MED ORDER — GADOBENATE DIMEGLUMINE 529 MG/ML IV SOLN
11.0000 mL | Freq: Once | INTRAVENOUS | Status: AC | PRN
  Administered 2020-11-06: 11 mL via INTRAVENOUS

## 2021-01-10 NOTE — Progress Notes (Signed)
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  She has anxiety and may be grinding/clenching teeth in her sleep.  She also has cervical spondylosis. 2  Syncope - likely vasovagal as she was in pain upon standing up from the couch.  Seizure felt less likely but given her episodes of confusion/cognitive deficits, will check EEG. 3  Cognitive deficits - suspect cerebrovascular.  Daughter concerned about Alzheimer's disease.  Unclear how much may be affected by anxiety as well 4  Anxiety   Will check routine EEG  I will check with Dr. Milbert Coulter, our neuropsychologist, to find out if neuropsychological evaluation with patient's daughter helping to translate would provide any meaningful results.  If not, then I would recommend PCP to consider starting donepezil and manage control of stroke risk factors, such as continued use of ASA 81mg  daily, cholesterol control, blood pressure control and glycemic control. Further recommendations pending results.   Subjective:  Heather Mills is a 70 year old female with RA and dyslipidemia who presents for headache.  History supplemented by cardiology note.  She speaks Farsi with limited understanding of 62.  Daughter helps interprets.   On 09/05/2020, she was feeling dizzy and felt confused.  Since 2021, she has exhibited memory problems.  She was taking a nap on the couch.  When she woke up, she had sudden onset severe sharp pain from the left chest wall radiating to the left shoulder, posterior left side of neck and diffuse headache  She stood up and briefly passed out.  No convulsions, incontinence, tongue biting or postictal confusion.  She went to the ED where CT head showed no acute  intracranial abnormality.  CT cervical spine showed multilevel degenerative changes.  A couple of weeks prior, she was seen by orthopedics and diagnosed with impingement syndrome of the left shoulder.  Saw cardiology.  Work up was negative.  Headaches have improved but not resolved.  She has a mild bitemporal nonthrobbing headache radiating from the jaw bilaterally occurring 2 days a week.  Treats with Tylenol.  She has reported anxiety.   She had an MRI of the brain on 02/23/2020 to evaluate her memory problems which was personally reviewed and revealed nonspecific T2/FLAIR hyperintensities in the subcortical and periventricular white matter, likely chronic small vessel ischemic changes.  No known family history of Alzheimer's disease.  As noted, she has chronic neck pain followed by orthopedics.  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  MEDICATIONS: Current Outpatient Medications on File Prior to Visit  Medication Sig Dispense Refill   acetaminophen (TYLENOL) 325 MG tablet Take 650 mg by mouth every 6 (six) hours as needed for moderate pain or headache.     aspirin EC 81 MG tablet Take 81 mg by mouth daily. Swallow whole.     atorvastatin (LIPITOR) 40 MG tablet Take 40 mg by mouth daily.     calcium acetate (PHOSLO) 667 MG capsule Take 1,334 mg by mouth 3 (three) times daily with meals.     FLUoxetine (PROZAC) 20  MG capsule Take 20 mg by mouth daily.     Glucosamine HCl 1500 MG TABS Take 1,500 mg by mouth daily.     hydroxychloroquine (PLAQUENIL) 200 MG tablet Take 300 mg by mouth daily.     ibuprofen (ADVIL) 800 MG tablet Take 1 tablet (800 mg total) by mouth 3 (three) times daily. 21 tablet 0   metoprolol tartrate (LOPRESSOR) 100 MG tablet Take 1 tablet by  mouth once for procedure. (Patient not taking: Reported on 10/17/2020) 1 tablet 0   Multiple Vitamin (MULTIVITAMIN) capsule Take 1 capsule by mouth daily.     naproxen sodium (ALEVE) 220 MG tablet Take 220 mg by mouth daily as needed (pain).     Omega-3 Fatty Acids (FISH OIL) 1000 MG CPDR Take 1,000 mg by mouth daily.     No current facility-administered medications on file prior to visit.    ALLERGIES: Allergies  Allergen Reactions   Doxycycline Nausea And Vomiting   Oxycodone-Acetaminophen Nausea And Vomiting    Nausea with vomiting and hives to stomach. Nausea with vomiting and hives to stomach. Nausea with vomiting and hives to stomach. Nausea with vomiting and hives to stomach.     FAMILY HISTORY: Family History  Adopted: Yes  Problem Relation Age of Onset   Cancer Father     Objective:  Blood pressure 110/61, pulse 80, height 5\' 2"  (1.575 m), weight 133 lb 3.2 oz (60.4 kg), SpO2 91 %. General: No acute distress.  Patient appears well-groomed.   Head:  Normocephalic/atraumatic, tenderness to palpation of both TMJs Eyes:  fundi examined but not visualized Neck: supple, no paraspinal tenderness, full range of motion Back: No paraspinal tenderness Heart: regular rate and rhythm Lungs: Clear to auscultation bilaterally. Vascular: No carotid bruits. Neurological Exam: Mental status: alert and oriented to person, place, and time, speech fluent and not dysarthric, language intact. Cranial nerves: CN I: not tested CN II: pupils equal, round and reactive to light, visual fields intact CN III, IV, VI:  full range of motion, no nystagmus, no ptosis CN V: facial sensation intact. CN VII: upper and lower face symmetric CN VIII: hearing intact CN IX, X: gag intact, uvula midline CN XI: sternocleidomastoid and trapezius muscles intact CN XII: tongue midline Bulk & Tone: normal, no fasciculations. Motor:  muscle strength 5/5 throughout Sensation:  Pinprick, temperature and  vibratory sensation intact. Deep Tendon Reflexes:  2+ throughout,  toes downgoing.   Finger to nose testing:  Without dysmetria.   Heel to shin:  Without dysmetria.   Gait:  Normal station and stride.  Romberg negative.    Thank you for allowing me to take part in the care of this patient.  , DO  CC: Shon Millet, MD

## 2021-01-11 ENCOUNTER — Ambulatory Visit (INDEPENDENT_AMBULATORY_CARE_PROVIDER_SITE_OTHER): Payer: Medicare Other | Admitting: Neurology

## 2021-01-11 ENCOUNTER — Encounter: Payer: Self-pay | Admitting: Neurology

## 2021-01-11 ENCOUNTER — Other Ambulatory Visit: Payer: Self-pay

## 2021-01-11 VITALS — BP 110/61 | HR 80 | Ht 62.0 in | Wt 133.2 lb

## 2021-01-11 DIAGNOSIS — F419 Anxiety disorder, unspecified: Secondary | ICD-10-CM | POA: Diagnosis not present

## 2021-01-11 DIAGNOSIS — G44219 Episodic tension-type headache, not intractable: Secondary | ICD-10-CM | POA: Diagnosis not present

## 2021-01-11 DIAGNOSIS — R413 Other amnesia: Secondary | ICD-10-CM | POA: Diagnosis not present

## 2021-01-11 DIAGNOSIS — M26609 Unspecified temporomandibular joint disorder, unspecified side: Secondary | ICD-10-CM | POA: Diagnosis not present

## 2021-01-11 DIAGNOSIS — R41841 Cognitive communication deficit: Secondary | ICD-10-CM | POA: Diagnosis not present

## 2021-01-11 DIAGNOSIS — R55 Syncope and collapse: Secondary | ICD-10-CM

## 2021-01-11 NOTE — Patient Instructions (Addendum)
Will check EEG I will check with our doctor who tests for memory and see if testing is a possibility.  If so, we will reach out to you to schedule an appointment Follow up with dentist regarding evaluation of TMJ dysfunction or preparing a mouth guard to wear at night Continue aspirin 81mg  daily and control of cholesterol and blood pressure. Further recommendations pending results.

## 2021-02-23 ENCOUNTER — Telehealth: Payer: Self-pay | Admitting: Cardiology

## 2021-02-23 NOTE — Telephone Encounter (Signed)
Pt c/o of Chest Pain: STAT if CP now or developed within 24 hours  1. Are you having CP right now? yes  2. Are you experiencing any other symptoms (ex. SOB, nausea, vomiting, sweating)? no  3. How long have you been experiencing CP? About a week  4. Is your CP continuous or coming and going? Continuous   5. Have you taken Nitroglycerin? No. Patient does not have an rx   Patient's daughter states the patient is having chest pain on her L side. It is tender to the touch and it hurts when she laughs or coughs   Patient's daughter does not want to wait until March to have the symptoms evaluated

## 2021-02-23 NOTE — Telephone Encounter (Signed)
Spoke to daughter. She states patient has pain on the left side of her chest  for about a week.  She states it hurts with if she touches the area , a deep breath , a cough ( not coughing a lot )   pain when she moves.   Blood pressure now 109/64  heart rate 73.    RN recommend  contacting primary for evaluation. Per Daughter they are not able to see  primary until next week. Appointment schedule for 02/24/21 at 4:30  with Dr Percival Spanish. Daughter verbalized understanding.

## 2021-02-23 NOTE — Progress Notes (Signed)
Cardiology Office Note   Date:  02/24/2021   ID:  Heather Mills, Heather Mills 1950-08-09, MRN [id number]  PCP:  Eartha Inch, MD  Cardiologist:   None   Chief Complaint  Patient presents with   Chest Pain      History of Present Illness: Heather Mills is a 71 y.o. female who presents for follow up of chest pain.  This is his first visit with me but he has been seen in our practice for follow up of chest pain and syncope.   She was found to have LM less than 50% stenosis, LAD less than 50% stenosis and no plaque in the RCA.  FFR again suggested no obstructive disease.  The event is extensively described in previous note.  This was in August.  There was severe pain going up into her neck and into the back of her head.  She had frank syncope for a brief period.  She has been having headaches since that time.  I did review the ER records.  She had a noncontrasted head CT which was unremarkable.  Since I last saw her she has been having chest pain.  This is a focal tenderness in her right upper chest.  It actually reproducible with palpation or certain movements.  She has not had any substernal chest discomfort, neck or arm discomfort.  She has aching down her left side in multiple joints.  She has been treated apparently for some arthritis.  She is not having any new neck discomfort.  Is not having any new shortness of breath, PND or orthopnea.  She has had no new palpitations, presyncope or syncope.  She has had no weight gain or edema.  She was added to my schedule chest pain.   Past Medical History:  Diagnosis Date   Dyslipidemia    Rheumatoid arthritis (HCC)     Past Surgical History:  Procedure Laterality Date   BUNIONECTOMY     CERVICAL DISC SURGERY     CHOLECYSTECTOMY     HYSTEROTOMY     full     Current Outpatient Medications  Medication Sig Dispense Refill   acetaminophen (TYLENOL) 325 MG tablet Take 650 mg by mouth every 6 (six) hours as needed for moderate pain or  headache.     aspirin EC 81 MG tablet Take 81 mg by mouth daily. Swallow whole.     atorvastatin (LIPITOR) 40 MG tablet Take 40 mg by mouth daily.     calcium acetate (PHOSLO) 667 MG capsule Take 1,334 mg by mouth 3 (three) times daily with meals.     FLUoxetine (PROZAC) 20 MG capsule Take 20 mg by mouth daily.     Glucosamine HCl 1500 MG TABS Take 1,500 mg by mouth daily.     hydroxychloroquine (PLAQUENIL) 200 MG tablet Take 300 mg by mouth daily.     ibuprofen (ADVIL) 800 MG tablet Take 1 tablet (800 mg total) by mouth 3 (three) times daily. 21 tablet 0   Multiple Vitamin (MULTIVITAMIN) capsule Take 1 capsule by mouth daily.     naproxen sodium (ALEVE) 220 MG tablet Take 220 mg by mouth daily as needed (pain).     Omega-3 Fatty Acids (FISH OIL) 1000 MG CPDR Take 1,000 mg by mouth daily.     No current facility-administered medications for this visit.    Allergies:   Doxycycline and Oxycodone-acetaminophen    ROS:  Please see the history of present illness.   Otherwise, review of systems  are positive for none.   All other systems are reviewed and negative.    PHYSICAL EXAM: VS:  BP 120/68    Pulse 64    Ht 5\' 2"  (1.575 m)    Wt 132 lb 3.2 oz (60 kg)    SpO2 97%    BMI 24.18 kg/m  , BMI Body mass index is 24.18 kg/m. GENERAL:  Well appearing NECK:  No jugular venous distention, waveform within normal limits, carotid upstroke brisk and symmetric, no bruits, no thyromegaly LUNGS:  Clear to auscultation bilaterally CHEST:  Unremarkable except for tenderness to palpation on the left upper chest HEART:  PMI not displaced or sustained,S1 and S2 within normal limits, no S3, no S4, no clicks, no rubs, no murmurs ABD:  Flat, positive bowel sounds normal in frequency in pitch, no bruits, no rebound, no guarding, no midline pulsatile mass, no hepatomegaly, no splenomegaly EXT:  2 plus pulses throughout, no edema, no cyanosis no clubbing   EKG:  EKG  ordered today. Sinus rhythm, rate 64, left  axis deviation, intervals within normal limits, no acute ST-T wave changes.   Recent Labs: 09/05/2020: BUN 15; Creatinine, Ser 0.80; Hemoglobin 13.0; Magnesium 2.2; Platelets 224; Potassium 3.9; Sodium 139    Lipid Panel No results found for: CHOL, TRIG, HDL, CHOLHDL, VLDL, LDLCALC, LDLDIRECT    Wt Readings from Last 3 Encounters:  02/24/21 132 lb 3.2 oz (60 kg)  01/11/21 133 lb 3.2 oz (60.4 kg)  10/17/20 130 lb 9.6 oz (59.2 kg)      Other studies Reviewed: Additional studies/ records that were reviewed today include: Labs Review of the above records demonstrates:  Please see elsewhere in the note.     ASSESSMENT AND PLAN:  CHEST PAIN:    Her chest discomfort is nonanginal.  It is reproducible with palpation.  She had nonobstructive disease previously.  We talked about use of ibuprofen which has previously been prescribed 800 mg 3 times a day.  I told her she could take up to that dose as she is only taking 200 mg a day.  She understands not to take Aleve at the same time.  She is going to talk to her primary provider about taking a muscle relaxant that she took one of her family members and she thought this helped.  SYNCOPE: She has had no further episodes.  No change in therapy  DYSLIPIDEMIA: LDL was was mildly elevated at 91 but with an HDL of 74.  She will continue the meds as listed.   LEG PAIN:   Her ABIs were actually improved from last check.  I do not think she is have any claudication.  No change in therapy.   Current medicines are reviewed at length with the patient today.  The patient does not have concerns regarding medicines.  The following changes have been made:  None  Labs/ tests ordered today include: None  Orders Placed This Encounter  Procedures   EKG 12-Lead      Disposition:   FU with me in 6 months.     Signed, Rollene RotundaJames Priscilla Finklea, MD  02/24/2021 3:27 PM    [facility]

## 2021-02-24 ENCOUNTER — Other Ambulatory Visit: Payer: Self-pay

## 2021-02-24 ENCOUNTER — Ambulatory Visit (INDEPENDENT_AMBULATORY_CARE_PROVIDER_SITE_OTHER): Payer: Medicare Other | Admitting: Cardiology

## 2021-02-24 ENCOUNTER — Encounter: Payer: Self-pay | Admitting: Cardiology

## 2021-02-24 VITALS — BP 120/68 | HR 64 | Ht 62.0 in | Wt 132.2 lb

## 2021-02-24 DIAGNOSIS — R55 Syncope and collapse: Secondary | ICD-10-CM

## 2021-02-24 DIAGNOSIS — E785 Hyperlipidemia, unspecified: Secondary | ICD-10-CM

## 2021-02-24 DIAGNOSIS — R072 Precordial pain: Secondary | ICD-10-CM

## 2021-02-24 NOTE — Patient Instructions (Signed)
Medication Instructions:  Your physician recommends that you continue on your current medications as directed. Please refer to the Current Medication list given to you today.   *If you need a refill on your cardiac medications before your next appointment, please call your pharmacy*   Lab Work: NONE If you have labs (blood work) drawn today and your tests are completely normal, you will receive your results only by: [place] (if you have MyChart) OR A paper copy in the mail If you have any lab test that is abnormal or we need to change your treatment, we will call you to review the results.   Testing/Procedures: NONE   Follow-Up: At [facility], you and your health needs are our priority.  As part of our continuing mission to provide you with exceptional heart care, we have created designated Provider Care Teams.  These Care Teams include your primary Cardiologist (physician) and Advanced Practice Providers (APPs -  Physician Assistants and Nurse Practitioners) who all work together to provide you with the care you need, when you need it.  We recommend signing up for the patient portal called "MyChart".  Sign up information is provided on this After Visit Summary.  MyChart is used to connect with patients for Virtual Visits (Telemedicine).  Patients are able to view lab/test results, encounter notes, upcoming appointments, etc.  Non-urgent messages can be sent to your provider as well.   To learn more about what you can do with MyChart, go to [email or web address].    Your next appointment:   6 month(s)  The format for your next appointment:   In Person  Provider:    [facility]

## 2021-03-09 ENCOUNTER — Ambulatory Visit (INDEPENDENT_AMBULATORY_CARE_PROVIDER_SITE_OTHER): Payer: Medicare Other | Admitting: Neurology

## 2021-03-09 ENCOUNTER — Other Ambulatory Visit: Payer: Self-pay

## 2021-03-09 DIAGNOSIS — R55 Syncope and collapse: Secondary | ICD-10-CM

## 2021-03-10 NOTE — Procedures (Signed)
ELECTROENCEPHALOGRAM REPORT  Date of Study: 03/09/2021  Patient's Name: Coy SaunasMahin Butters MRN: [id number] Date of Birth: [birth year]   Clinical History: 71 year old female who presents with headache and syncope  Medications: TYLENOL 325 MG tablet aspirin EC 81 MG tablet LIPITOR 40 MG tablet PHOSLO 667 MG capsule PROZAC 20 MG capsule Glucosamine HCl 1500 MG TABS PLAQUENIL 200 MG tablet ADVIL 800 MG tablet MULTIVITAMIN capsule ALEVE 220 MG tablet FISH OIL 1000 MG CPDR  Technical Summary: A multichannel digital EEG recording measured by the international 10-20 system with electrodes applied with paste and impedances below 5000 ohms performed in our laboratory with EKG monitoring in an awake and drowsy patient.  Photic stimulation was performed.  The digital EEG was referentially recorded, reformatted, and digitally filtered in a variety of bipolar and referential montages for optimal display.    Description: The patient is awake and drowsy during the recording.  During maximal wakefulness, there is a symmetric, medium voltage 9 Hz posterior dominant rhythm that attenuates with eye opening.  The record is symmetric.  Stage 2 sleep is not seen.  Photic stimulation did not elicit any abnormalities.  There were no epileptiform discharges or electrographic seizures seen.    EKG lead was unremarkable.  Impression: This awake and drowsy EEG is normal.    Clinical Correlation: A normal EEG does not exclude a clinical diagnosis of epilepsy.  If further clinical questions remain, prolonged EEG may be helpful.  Clinical correlation is advised.   Shon MilletAdam Kevona Lupinacci, DO

## 2021-04-17 ENCOUNTER — Ambulatory Visit: Payer: Medicare Other | Admitting: Cardiology

## 2021-07-26 ENCOUNTER — Emergency Department (HOSPITAL_COMMUNITY)
Admission: EM | Admit: 2021-07-26 | Discharge: 2021-07-27 | Disposition: A | Discharge status: Home / Self Care | Payer: Medicare Other | Attending: Student | Admitting: Student

## 2021-07-26 ENCOUNTER — Emergency Department (HOSPITAL_COMMUNITY): Payer: Medicare Other

## 2021-07-26 ENCOUNTER — Encounter (HOSPITAL_COMMUNITY): Payer: Self-pay | Admitting: Emergency Medicine

## 2021-07-26 ENCOUNTER — Other Ambulatory Visit: Payer: Self-pay

## 2021-07-26 DIAGNOSIS — M069 Rheumatoid arthritis, unspecified: Secondary | ICD-10-CM | POA: Insufficient documentation

## 2021-07-26 DIAGNOSIS — R4182 Altered mental status, unspecified: Secondary | ICD-10-CM | POA: Insufficient documentation

## 2021-07-26 DIAGNOSIS — Z5321 Procedure and treatment not carried out due to patient leaving prior to being seen by health care provider: Secondary | ICD-10-CM | POA: Insufficient documentation

## 2021-07-26 DIAGNOSIS — R410 Disorientation, unspecified: Secondary | ICD-10-CM | POA: Insufficient documentation

## 2021-07-26 DIAGNOSIS — Z20822 Contact with and (suspected) exposure to covid-19: Secondary | ICD-10-CM | POA: Diagnosis not present

## 2021-07-26 DIAGNOSIS — Z79899 Other long term (current) drug therapy: Secondary | ICD-10-CM | POA: Insufficient documentation

## 2021-07-26 LAB — COMPREHENSIVE METABOLIC PANEL
ALT: 15 U/L (ref 0–44)
AST: 25 U/L (ref 15–41)
Albumin: 3.7 g/dL (ref 3.5–5.0)
Alkaline Phosphatase: 57 U/L (ref 38–126)
Anion gap: 10 (ref 5–15)
BUN: 16 mg/dL (ref 8–23)
CO2: 26 mmol/L (ref 22–32)
Calcium: 9.3 mg/dL (ref 8.9–10.3)
Chloride: 105 mmol/L (ref 98–111)
Creatinine, Ser: 1.06 mg/dL — ABNORMAL HIGH (ref 0.44–1.00)
GFR, Estimated: 56 mL/min — ABNORMAL LOW (ref >60)
Glucose, Bld: 97 mg/dL (ref 70–99)
Potassium: 4 mmol/L (ref 3.5–5.1)
Sodium: 141 mmol/L (ref 135–145)
Total Bilirubin: 0.4 mg/dL (ref 0.3–1.2)
Total Protein: 6.4 g/dL — ABNORMAL LOW (ref 6.5–8.1)

## 2021-07-26 LAB — URINALYSIS, ROUTINE W REFLEX MICROSCOPIC
Bacteria, UA: NONE SEEN
Bilirubin Urine: NEGATIVE
Glucose, UA: NEGATIVE mg/dL
Hgb urine dipstick: NEGATIVE
Ketones, ur: NEGATIVE mg/dL
Nitrite: NEGATIVE
Protein, ur: NEGATIVE mg/dL
Specific Gravity, Urine: 1.009 (ref 1.005–1.030)
pH: 5 (ref 5.0–8.0)

## 2021-07-26 LAB — DIFFERENTIAL
Abs Immature Granulocytes: 0.03 10*3/uL (ref 0.00–0.07)
Basophils Absolute: 0.1 10*3/uL (ref 0.0–0.1)
Basophils Relative: 1 %
Eosinophils Absolute: 0.1 10*3/uL (ref 0.0–0.5)
Eosinophils Relative: 2 %
Immature Granulocytes: 0 %
Lymphocytes Relative: 33 %
Lymphs Abs: 2.6 10*3/uL (ref 0.7–4.0)
Monocytes Absolute: 0.7 10*3/uL (ref 0.1–1.0)
Monocytes Relative: 8 %
Neutro Abs: 4.5 10*3/uL (ref 1.7–7.7)
Neutrophils Relative %: 56 %

## 2021-07-26 LAB — CBC
HCT: 37.8 % (ref 36.0–46.0)
Hemoglobin: 12.6 g/dL (ref 12.0–15.0)
MCH: 32.6 pg (ref 26.0–34.0)
MCHC: 33.3 g/dL (ref 30.0–36.0)
MCV: 97.7 fL (ref 80.0–100.0)
Platelets: 242 10*3/uL (ref 150–400)
RBC: 3.87 MIL/uL (ref 3.87–5.11)
RDW: 13.2 % (ref 11.5–15.5)
WBC: 8 10*3/uL (ref 4.0–10.5)
nRBC: 0 % (ref 0.0–0.2)

## 2021-07-26 LAB — I-STAT CHEM 8, ED
BUN: 17 mg/dL (ref 8–23)
Calcium, Ion: 1.18 mmol/L (ref 1.15–1.40)
Chloride: 104 mmol/L (ref 98–111)
Creatinine, Ser: 1 mg/dL (ref 0.44–1.00)
Glucose, Bld: 93 mg/dL (ref 70–99)
HCT: 37 % (ref 36.0–46.0)
Hemoglobin: 12.6 g/dL (ref 12.0–15.0)
Potassium: 3.9 mmol/L (ref 3.5–5.1)
Sodium: 140 mmol/L (ref 135–145)
TCO2: 27 mmol/L (ref 22–32)

## 2021-07-26 LAB — RESP PANEL BY RT-PCR (FLU A&B, COVID) ARPGX2
Influenza A by PCR: NEGATIVE
Influenza B by PCR: NEGATIVE
SARS Coronavirus 2 by RT PCR: NEGATIVE

## 2021-07-26 LAB — ETHANOL: Alcohol, Ethyl (B): <10 mg/dL (ref <10)

## 2021-07-26 NOTE — ED Triage Notes (Addendum)
Patient's daughter  reports elevated blood pressure this evening 140/80 and brief episode of confusion .

## 2021-07-26 NOTE — ED Provider Triage Note (Signed)
Emergency Medicine Provider Triage Evaluation Note  Heather Mills , a 71 y.o. female  was evaluated in triage.  Pt with history of RA on hydroxychloroquine who presents with her daughter at the bedside with concern for episode of confusion this evening.  Patient speaks only Farsi and her daughter at the bedside is acting as interpreter per family's preference.  She states that the patient was riding in a vehicle with her family when she acutely slumped forward, and with though she was awake she was not responding appropriately to their questions, was confused and not making sense when she spoke.  This lasted for approximately 60 seconds.  The patient states that she remembers the whole incident and felt like she could hear what they were saying but could not figure out how to respond appropriately.  Cording to her daughter at the bedside the symptoms lasted for approximately 60 seconds.  Complete resolved at this time.  Blood pressure was 140 systolic after this episode, she says that her baseline is near 100 systolic.  Review of Systems  Positive: Confusion elevated blood pressure Negative: Chest pain, shortness of breath, nausea vomiting diarrhea, slurred speech, blurry vision, double vision, weakness.  Physical Exam  BP (!) 141/81   Pulse 76   Temp 98.7 F (37.1 C) (Oral)   Resp 15   LMP  (LMP Unknown)   SpO2 96%  Gen:   Awake, no distress   Resp:  Normal effort  MSK:   Moves extremities without difficulty  Other:  Cranial nerves II through XII are intact, symmetric strength sensation upper and lower extremities bilaterally, no pronator drift, normal fine motor coordination, finger-to-nose testing.  No focal deficit on neurologic exam.  Medical Decision Making  Medically screening exam initiated at 10:20 PM.  Appropriate orders placed.  Heather Mills was informed that the remainder of the evaluation will be completed by another provider, this initial triage assessment does not replace that  evaluation, and the importance of remaining in the ED until their evaluation is complete.  Patient with history of vasovagal syncope in the past, however was sitting calmly riding in a vehicle today.  Daughter states that the patient has been very stressed in the last week though she does not sure why.  Question possible TIA we will proceed with work-up at this time.  No code stroke activated as patient has no focal deficit on neurologic exam and her symptoms have completely resolved at this time.  This chart was dictated using voice recognition software, Dragon. Despite the best efforts of this provider to proofread and correct errors, errors may still occur which can change documentation meaning.    Paris Lore, PA-C 07/26/21 [id number]

## 2021-07-27 LAB — APTT: aPTT: 26 seconds (ref 24–36)

## 2021-07-27 LAB — RAPID URINE DRUG SCREEN, HOSP PERFORMED
Amphetamines: NOT DETECTED
Barbiturates: NOT DETECTED
Benzodiazepines: POSITIVE — AB
Cocaine: NOT DETECTED
Opiates: NOT DETECTED
Tetrahydrocannabinol: NOT DETECTED

## 2021-07-27 LAB — PROTIME-INR
INR: 1 (ref 0.8–1.2)
Prothrombin Time: 13.3 seconds (ref 11.4–15.2)

## 2021-07-27 NOTE — ED Notes (Signed)
Pt left [facility] due to long wait times.  

## 2021-09-16 NOTE — Progress Notes (Unsigned)
Cardiology Office Note   Date:  09/18/2021   ID:  Heather, Mills [birth year], MRN [id number]  PCP:  Eartha Inch, MD  Cardiologist:   None   Chief Complaint  Patient presents with   Chest Pain      History of Present Illness: Heather Mills is a 71 y.o. female who presents for follow up of chest pain.  This is his first visit with me but he has been seen in our practice for follow up of chest pain and syncope.   She was found to have LM less than 50% stenosis, LAD less than 50% stenosis and no plaque in the RCA.  FFR again suggested no obstructive disease.  Since I last saw her she had 1 episode of altered mental status.  It was difficult to assess with review of the syncope.  This was in June.  Reading through some ER records there was not an absolute loss of consciousness.  Her speech got garbled and she slumped forward.  Turns out her drug screen was positive for benzodiazepines and I reviewed this with her because they are not listed on her med list.  Her daughter says that she probably took some Librium which she had access to.  She has not had any more events since then.  She denies any chest pressure, neck or arm discomfort.  She gets a little discomfort only if she is stressed.  She has been swimming for exercise and with this she feels good.  She does not get chest pain with this.  She does not have shortness of breath.   Past Medical History:  Diagnosis Date   Dyslipidemia    Rheumatoid arthritis (HCC)     Past Surgical History:  Procedure Laterality Date   BUNIONECTOMY     CERVICAL DISC SURGERY     CHOLECYSTECTOMY     HYSTEROTOMY     full     Current Outpatient Medications  Medication Sig Dispense Refill   acetaminophen (TYLENOL) 325 MG tablet Take 650 mg by mouth every 6 (six) hours as needed for moderate pain or headache.     aspirin EC 81 MG tablet Take 81 mg by mouth daily. Swallow whole.     atorvastatin (LIPITOR) 40 MG tablet Take 40 mg by mouth  daily.     calcium acetate (PHOSLO) 667 MG capsule Take 1,334 mg by mouth 3 (three) times daily with meals.     Glucosamine HCl 1500 MG TABS Take 1,500 mg by mouth daily.     hydroxychloroquine (PLAQUENIL) 200 MG tablet Take 300 mg by mouth daily.     ibuprofen (ADVIL) 800 MG tablet Take 1 tablet (800 mg total) by mouth 3 (three) times daily. 21 tablet 0   Multiple Vitamin (MULTIVITAMIN) capsule Take 1 capsule by mouth daily.     naproxen sodium (ALEVE) 220 MG tablet Take 220 mg by mouth daily as needed (pain).     Omega-3 Fatty Acids (FISH OIL) 1000 MG CPDR Take 1,000 mg by mouth daily.     FLUoxetine (PROZAC) 10 MG capsule Take 1 capsule (10 mg total) by mouth daily. 90 capsule 0   No current facility-administered medications for this visit.    Allergies:   Doxycycline and Oxycodone-acetaminophen    ROS:  Please see the history of present illness.   Otherwise, review of systems are positive for none.   All other systems are reviewed and negative.    PHYSICAL EXAM: VS:  BP 122/60  Pulse 70   Ht 5\' 2"  (1.575 m)   Wt 126 lb 12.8 oz (57.5 kg)   LMP  (LMP Unknown)   SpO2 97%   BMI 23.19 kg/m  , BMI Body mass index is 23.19 kg/m. GENERAL:  Well appearing NECK:  No jugular venous distention, waveform within normal limits, carotid upstroke brisk and symmetric, no bruits, no thyromegaly LUNGS:  Clear to auscultation bilaterally CHEST:  Unremarkable HEART:  PMI not displaced or sustained,S1 and S2 within normal limits, no S3, no S4, no clicks, no rubs, no murmurs ABD:  Flat, positive bowel sounds normal in frequency in pitch, no bruits, no rebound, no guarding, no midline pulsatile mass, no hepatomegaly, no splenomegaly EXT:  2 plus pulses throughout, no edema, no cyanosis no clubbing    EKG:  EKG is not  ordered today. Sinus rhythm, rate 72, left axis deviation, intervals within normal limits, no acute ST-T wave changes.  07/26/2021   Recent Labs: 07/26/2021: ALT 15; BUN 17;  Creatinine, Ser 1.00; Hemoglobin 12.6; Platelets 242; Potassium 3.9; Sodium 140    Lipid Panel No results found for: "CHOL", "TRIG", "HDL", "CHOLHDL", "VLDL", "LDLCALC", "LDLDIRECT"    Wt Readings from Last 3 Encounters:  09/18/21 126 lb 12.8 oz (57.5 kg)  02/24/21 132 lb 3.2 oz (60 kg)  01/11/21 133 lb 3.2 oz (60.4 kg)      Other studies Reviewed: Additional studies/ records that were reviewed today include: ED records Review of the above records demonstrates:  Please see elsewhere in the note.     ASSESSMENT AND PLAN:  CHEST PAIN:    She has had no new chest pain.  She has nonobstructive coronary disease as described.  We are going to continue with aggressive risk reduction.   SYNCOPE: I reviewed the ED records and there was not a syncopal episode.  Is probably related to Librium that she had taken.  We talked about this.   DYSLIPIDEMIA:    I will draw fasting lipid profile.  The goal will be an LDL less than 70.  We talked about diet.   DEPRESSION ANXIETY: We talked about only taking a benzodiazepine if it is prescribed.  I did go ahead and we wrote her prescription for Prozac to 10 mg as she prefers a lower dose rather than the 20 mg dose.  I will do this for one 27-month supply and then ask her to get it from her primary provider after this.   Current medicines are reviewed at length with the patient today.  The patient does not have concerns regarding medicines.  The following changes have been made: As above  Labs/ tests ordered today include:   Orders Placed This Encounter  Procedures   Lipid panel      Disposition:   FU with me in 1 months.     Signed, 2-month, MD  09/18/2021 1:00 PM    [facility]

## 2021-09-18 ENCOUNTER — Encounter: Payer: Self-pay | Admitting: Cardiology

## 2021-09-18 ENCOUNTER — Ambulatory Visit (INDEPENDENT_AMBULATORY_CARE_PROVIDER_SITE_OTHER): Payer: Medicare Other | Admitting: Cardiology

## 2021-09-18 VITALS — BP 122/60 | HR 70 | Ht 62.0 in | Wt 126.8 lb

## 2021-09-18 DIAGNOSIS — E785 Hyperlipidemia, unspecified: Secondary | ICD-10-CM | POA: Diagnosis not present

## 2021-09-18 DIAGNOSIS — R55 Syncope and collapse: Secondary | ICD-10-CM | POA: Diagnosis not present

## 2021-09-18 DIAGNOSIS — R072 Precordial pain: Secondary | ICD-10-CM

## 2021-09-18 MED ORDER — FLUOXETINE HCL 10 MG PO CAPS: 10.0000 mg | ORAL_CAPSULE | Freq: Every day | ORAL | 0 refills | Status: DC

## 2021-09-18 NOTE — Patient Instructions (Signed)
Medication Instructions:   REDUCE PROZAC TO 10 MG ONCE DAILY=1/2 OF THE 20 MG TABLET ONCE DAILY  *If you need a refill on your cardiac medications before your next appointment, please call your pharmacy*   Lab Work:  Your physician recommends that you return for lab work FASTING  If you have labs (blood work) drawn today and your tests are completely normal, you will receive your results only by: MyChart Message (if you have MyChart) OR A paper copy in the mail If you have any lab test that is abnormal or we need to change your treatment, we will call you to review the results.    Follow-Up: At [facility], you and your health needs are our priority.  As part of our continuing mission to provide you with exceptional heart care, we have created designated Provider Care Teams.  These Care Teams include your primary Cardiologist (physician) and Advanced Practice Providers (APPs -  Physician Assistants and Nurse Practitioners) who all work together to provide you with the care you need, when you need it.  We recommend signing up for the patient portal called "MyChart".  Sign up information is provided on this After Visit Summary.  MyChart is used to connect with patients for Virtual Visits (Telemedicine).  Patients are able to view lab/test results, encounter notes, upcoming appointments, etc.  Non-urgent messages can be sent to your provider as well.   To learn more about what you can do with MyChart, go to [email or web address].    Your next appointment:   12 month(s)  The format for your next appointment:   In Person  Provider:   Rollene Rotunda MD

## 2021-09-26 LAB — LIPID PANEL
Chol/HDL Ratio: 2.3 ratio (ref 0.0–4.4)
Cholesterol, Total: 176 mg/dL (ref 100–199)
HDL: 76 mg/dL (ref >39)
LDL Chol Calc (NIH): 86 mg/dL (ref 0–99)
Triglycerides: 72 mg/dL (ref 0–149)
VLDL Cholesterol Cal: 14 mg/dL (ref 5–40)

## 2021-09-28 ENCOUNTER — Telehealth: Payer: Self-pay | Admitting: *Deleted

## 2021-09-28 DIAGNOSIS — E785 Hyperlipidemia, unspecified: Secondary | ICD-10-CM

## 2021-09-28 MED ORDER — ATORVASTATIN CALCIUM 80 MG PO TABS: 80.0000 mg | ORAL_TABLET | Freq: Every day | ORAL | 3 refills | Status: DC

## 2021-09-28 NOTE — Telephone Encounter (Signed)
-----   Message from Rollene Rotunda, MD sent at 09/27/2021  9:36 AM EDT ----- I would suggest increase to Lipitor 80 mg and repeat a lipid profile in 10 weeks.   Call Ms. Zentz with the results and send results to Eartha Inch, MD

## 2021-12-08 LAB — LIPID PANEL
Chol/HDL Ratio: 2.5 ratio (ref 0.0–4.4)
Cholesterol, Total: 183 mg/dL (ref 100–199)
HDL: 73 mg/dL (ref >39)
LDL Chol Calc (NIH): 98 mg/dL (ref 0–99)
Triglycerides: 63 mg/dL (ref 0–149)
VLDL Cholesterol Cal: 12 mg/dL (ref 5–40)

## 2021-12-14 ENCOUNTER — Telehealth: Payer: Self-pay | Admitting: Cardiology

## 2021-12-14 DIAGNOSIS — Z79899 Other long term (current) drug therapy: Secondary | ICD-10-CM

## 2021-12-14 MED ORDER — ROSUVASTATIN CALCIUM 40 MG PO TABS: 40.0000 mg | ORAL_TABLET | Freq: Every day | ORAL | 3 refills | Status: DC

## 2021-12-14 NOTE — Telephone Encounter (Signed)
Niece advised of pharmD reply: "Avoid grapefruit juice quantities greater than 1.2 L daily in patients taking statins to avoid the potential for drug accumulation and toxicity." Niece said patient eats a grapefruit every other day to bring her chol down. Explained to follow the advise of pharmD. She verbalized understanding.

## 2021-12-14 NOTE — Telephone Encounter (Signed)
Avoid grapefruit juice quantities greater than 1.2 L daily in patients taking statins to avoid the potential for drug accumulation and toxicity

## 2021-12-14 NOTE — Telephone Encounter (Signed)
Pt's daughter is requesting call back to discuss results and med change suggested in MyChart.

## 2021-12-14 NOTE — Telephone Encounter (Signed)
Gave daughter results per Dr. Antoine Poche: "Creat is not quite at target.  I would suggest stop Lipitor and start Crestor 40 mg PO daily and check a lipid profile in 12 weeks." Daughter asked if patient could eat grapefruit or grapefruit juice while on rosuvastatin. Recommended that she not eat grapefruit. Will forward to pharmacy. Order placed for rosuvastatin and sent to preferred pharmacy.

## 2021-12-18 ENCOUNTER — Other Ambulatory Visit: Payer: Self-pay | Admitting: *Deleted

## 2021-12-18 DIAGNOSIS — Z79899 Other long term (current) drug therapy: Secondary | ICD-10-CM

## 2021-12-25 ENCOUNTER — Other Ambulatory Visit (HOSPITAL_COMMUNITY): Payer: Self-pay | Admitting: Gastroenterology

## 2021-12-25 DIAGNOSIS — R1084 Generalized abdominal pain: Secondary | ICD-10-CM

## 2021-12-25 DIAGNOSIS — K7689 Other specified diseases of liver: Secondary | ICD-10-CM

## 2021-12-28 ENCOUNTER — Ambulatory Visit (HOSPITAL_COMMUNITY)
Admission: RE | Admit: 2021-12-28 | Discharge: 2021-12-28 | Disposition: A | Discharge status: Home / Self Care | Payer: Medicare Other | Source: Ambulatory Visit | Attending: Gastroenterology | Admitting: Gastroenterology

## 2021-12-28 DIAGNOSIS — R1084 Generalized abdominal pain: Secondary | ICD-10-CM | POA: Diagnosis present

## 2021-12-28 DIAGNOSIS — K7689 Other specified diseases of liver: Secondary | ICD-10-CM | POA: Insufficient documentation

## 2021-12-28 MED ORDER — SODIUM CHLORIDE (PF) 0.9 % IJ SOLN
INTRAMUSCULAR | Status: AC
  Filled 2021-12-28: qty 50

## 2021-12-28 MED ORDER — IOHEXOL 300 MG/ML  SOLN
100.0000 mL | Freq: Once | INTRAMUSCULAR | Status: AC | PRN
  Administered 2021-12-28: 100 mL via INTRAVENOUS

## 2022-01-01 ENCOUNTER — Encounter: Payer: Self-pay | Admitting: Cardiology

## 2022-01-01 ENCOUNTER — Telehealth: Payer: Self-pay | Admitting: Cardiology

## 2022-01-01 NOTE — Telephone Encounter (Signed)
Pt's daughter is requesting call back in regards to CT results.

## 2022-01-01 NOTE — Telephone Encounter (Signed)
Multiple encounter open. Please see phone note from today for complete details. Closing this encounter.

## 2022-01-01 NOTE — Telephone Encounter (Signed)
Spoke with pt's daughter regarding abdominal CT that was done recently due to stomach discomfort. Daughter is calling because of impression states that pt has aortic atherosclerosis. Daughter would like to know if there is anything else they can do to prevent this from worsening. Explained to daughter that pt should continue taking her crestor 40mg  daily and that I would forward to Dr. to further advise. Daughter verbalizes understanding.

## 2022-01-03 NOTE — Telephone Encounter (Signed)
Spoke with pt daughter, aware of dr hochrein's recommendations. 

## 2022-04-02 ENCOUNTER — Other Ambulatory Visit: Payer: Self-pay | Admitting: *Deleted

## 2022-04-02 ENCOUNTER — Encounter: Payer: Self-pay | Admitting: *Deleted

## 2022-07-13 ENCOUNTER — Telehealth: Payer: Self-pay | Admitting: *Deleted

## 2022-07-13 DIAGNOSIS — E785 Hyperlipidemia, unspecified: Secondary | ICD-10-CM

## 2022-07-13 LAB — LIPID PANEL
Chol/HDL Ratio: 2.2 ratio (ref 0.0–4.4)
Cholesterol, Total: 189 mg/dL (ref 100–199)
HDL: 85 mg/dL (ref >39)
LDL Chol Calc (NIH): 90 mg/dL (ref 0–99)
Triglycerides: 75 mg/dL (ref 0–149)
VLDL Cholesterol Cal: 14 mg/dL (ref 5–40)

## 2022-07-13 MED ORDER — EZETIMIBE 10 MG PO TABS: 10.0000 mg | ORAL_TABLET | Freq: Every day | ORAL | 3 refills | Status: DC

## 2022-07-13 NOTE — Telephone Encounter (Signed)
-----   Message from Rollene Rotunda, MD sent at 07/13/2022  3:34 PM EDT ----- Lipids are not at target.  I would like to add Zetia 10 mg po daily and check a lipid profile in 3 months.   Call Ms. Horger with the results and send results to Eartha Inch, MD

## 2022-07-13 NOTE — Telephone Encounter (Signed)
Pt has reviewed results via my chart  New script sent to the pharmacy  Lab orders mailed to the pt  

## 2022-11-20 ENCOUNTER — Encounter: Payer: Self-pay | Admitting: *Deleted

## 2022-12-13 ENCOUNTER — Other Ambulatory Visit: Payer: Self-pay | Admitting: Cardiology

## 2022-12-15 LAB — LIPID PANEL
Chol/HDL Ratio: 2.2 {ratio} (ref 0.0–4.4)
Cholesterol, Total: 141 mg/dL (ref 100–199)
HDL: 65 mg/dL (ref >39)
LDL Chol Calc (NIH): 61 mg/dL (ref 0–99)
Triglycerides: 76 mg/dL (ref 0–149)
VLDL Cholesterol Cal: 15 mg/dL (ref 5–40)

## 2023-01-07 ENCOUNTER — Other Ambulatory Visit: Payer: Self-pay | Admitting: Cardiology

## 2023-02-05 ENCOUNTER — Other Ambulatory Visit: Payer: Self-pay | Admitting: Cardiology

## 2023-02-27 ENCOUNTER — Encounter (HOSPITAL_COMMUNITY): Payer: Self-pay | Admitting: Orthopedic Surgery

## 2023-02-27 ENCOUNTER — Other Ambulatory Visit: Payer: Self-pay

## 2023-02-27 NOTE — Progress Notes (Signed)
Anesthesia Chart Review: Same day workup  73 year old female with pertinent history including PONV, rheumatoid arthritis, HLD.  Patient patient had prior cardiology evaluation for atypical chest pain in August 2022.  Coronary CTA with FFR suggested no obstructive disease.  She was recommended to continue aggressive risk reduction.  Patient will need day of surgery labs and evaluation.  EKG 07/26/2021: NSR.  Rate 72.  Low-voltage QRS.  Coronary CTA with FFR analysis 09/21/20: 1. Left Main: No significant stenosis.   2. LAD: 0.87 mid LAD. 3. LCX: No significant stenosis. 4. RCA: No significant stenosis.   IMPRESSION: 1.  CT FFR analysis didn't show any significant stenosis.    Zannie Cove [facility] Stay [facility]/Anesthesiology Phone [phone] 02/27/2023 2:47 PM

## 2023-02-27 NOTE — Anesthesia Preprocedure Evaluation (Signed)
Anesthesia Evaluation  Patient identified by MRN, date of birth, ID band Patient awake    Reviewed: Allergy & Precautions, NPO status , Patient's Chart, lab work & pertinent test results  History of Anesthesia Complications (+) PONV and history of anesthetic complications  Airway Mallampati: II  TM Distance: >3 FB Neck ROM: Full    Dental  (+) Teeth Intact, Dental Advisory Given   Pulmonary neg pulmonary ROS   Pulmonary exam normal breath sounds clear to auscultation       Cardiovascular negative cardio ROS Normal cardiovascular exam Rhythm:Regular Rate:Normal     Neuro/Psych  PSYCHIATRIC DISORDERS Anxiety Depression    negative neurological ROS     GI/Hepatic negative GI ROS, Neg liver ROS,,,  Endo/Other  negative endocrine ROS    Renal/GU negative Renal ROS     Musculoskeletal  (+) Arthritis , Rheumatoid disorders,  left distal radius fracture    Abdominal   Peds  Hematology negative hematology ROS (+)   Anesthesia Other Findings Day of surgery medications reviewed with the patient.  Reproductive/Obstetrics                              Anesthesia Physical Anesthesia Plan  ASA: 2  Anesthesia Plan: Regional   Post-op Pain Management: Regional block* and Tylenol PO (pre-op)*   Induction: Intravenous  PONV Risk Score and Plan: 3 and TIVA, Dexamethasone and Ondansetron  Airway Management Planned: Natural Airway and Simple Face Mask  Additional Equipment:   Intra-op Plan:   Post-operative Plan:   Informed Consent: I have reviewed the patients History and Physical, chart, labs and discussed the procedure including the risks, benefits and alternatives for the proposed anesthesia with the patient or authorized representative who has indicated his/her understanding and acceptance.     Dental advisory given  Plan Discussed with: CRNA  Anesthesia Plan Comments: (PAT note by  Antionette Poles, PA-C: 73 year old female with pertinent history including PONV, rheumatoid arthritis, HLD.  Patient patient had prior cardiology evaluation for atypical chest pain in August 2022.  Coronary CTA with FFR suggested no obstructive disease.  She was recommended to continue aggressive risk reduction.  Patient will need day of surgery labs and evaluation.  EKG 07/26/2021: NSR.  Rate 72.  Low-voltage QRS.  Coronary CTA with FFR analysis 09/21/20: 1. Left Main: No significant stenosis.   2. LAD: 0.87 mid LAD. 3. LCX: No significant stenosis. 4. RCA: No significant stenosis.   IMPRESSION: 1.  CT FFR analysis didn't show any significant stenosis.  )         Anesthesia Quick Evaluation

## 2023-02-27 NOTE — Progress Notes (Signed)
Surgical Instructions   Your procedure is scheduled on Thursday, January 30th, 2025. Report to [facility] Main Entrance "A" at 1:45 A.M., then check in with the Admitting office. Any questions or running late day of surgery: call [phone]  Questions prior to your surgery date: call [phone], Monday-Friday, 8am-4pm. If you experience any cold or flu symptoms such as cough, fever, chills, shortness of breath, etc. between now and your scheduled surgery, please notify us at the above number.     Remember:  Do not eat after midnight the night before your surgery   You may drink clear liquids until 1:15 the day of your surgery.   Clear liquids allowed are: Water, Non-Citrus Juices (without pulp), Carbonated Beverages, Clear Tea (no milk, honey, etc.), Black Coffee Only (NO MILK, CREAM OR POWDERED CREAMER of any kind), and Gatorade.    Take these medicines the morning of surgery with A SIP OF WATER: Ezetimibe (Zetia) Fluoxetine (Prozac) Rosuvastatin (Crestor)   May take these medicines IF NEEDED:  Acetaminophen (Tylenol)  The above medications can also be taken on Wednesday, January 29th.   One week prior to surgery, STOP taking any Aspirin (unless otherwise instructed by your surgeon) Aleve, Naproxen, Ibuprofen, Motrin, Advil, Goody's, BC's, all herbal medications, fish oil, and non-prescription vitamins.  Follow your rheumatologist's instructions on Methotrexate.                          You will be asked to remove any contacts, glasses, piercing's, hearing aid's, dentures/partials prior to surgery. Please bring cases for these items if needed.    Patients discharged the day of surgery will not be allowed to drive home, and someone needs to stay with them for 24 hours.  SURGICAL WAITING ROOM VISITATION Patients may have no more than 2 support people in the waiting area - these visitors may rotate.   Pre-op nurse will coordinate an appropriate time for 1 ADULT support  person, who may not rotate, to accompany patient in pre-op.  Children under the age of 9 must have an adult with them who is not the patient and must remain in the main waiting area with an adult.  If the patient needs to stay at the hospital during part of their recovery, the visitor guidelines for inpatient rooms apply.  Please refer to the [facility] website for the visitor guidelines for any additional information.   If you received a COVID test during your pre-op visit  it is requested that you wear a mask when out in public, stay away from anyone that may not be feeling well and notify your surgeon if you develop symptoms. If you have been in contact with anyone that has tested positive in the last 10 days please notify you surgeon.      Additional instructions for the day of surgery: DO NOT APPLY any lotions, deodorants, cologne, or perfumes.   Do not wear jewelry or makeup Do not wear nail polish, gel polish, artificial nails, or any other type of covering on natural nails (fingers and toes) Do not bring valuables to the hospital. [facility] is not responsible for valuables/personal belongings. Put on clean/comfortable clothes.  Please brush your teeth.  Ask your nurse before applying any prescription medications to the skin.

## 2023-02-27 NOTE — Progress Notes (Signed)
Spoke with pt's daughter Nita Sells, pt will arrive tom at 1300.  NPO post midnight, will stop clear liquids at 1230.

## 2023-02-27 NOTE — Progress Notes (Signed)
SDW CALL  Patient's daughter was given pre-op instructions over the phone. The opportunity was given for the patient's daughter to ask questions. No further questions asked. Patient's daughter verbalized understanding of instructions given.   PCP - Jasper Loser Rheumatologist - Dr. Alben Deeds Cardiologist - Dr. Rollene Rotunda but not seen since 08/2021  PPM/ICD - denies Device Orders - n/a Rep Notified - n/a  Chest x-ray - denies EKG - 07/27/21 Stress Test - denies ECHO - denies Cardiac Cath - denies  Sleep Study - denies CPAP - n/a  No DM  Last dose of GLP1 agonist-  n/a GLP1 instructions: n/a  Blood Thinner Instructions: n/a Aspirin Instructions: daughter states patient is not taking Aspirin on DOS  ERAS Protcol - clears until 1315   COVID TEST- n/a - tested positive for Covid in November 2024   Anesthesia review: yes  Patient's daughter denies shortness of breath, fever, cough and chest pain over the phone call   All instructions explained to the patient, with a verbal understanding of the material. Patient's daughter agrees to go over the instructions while at home for a better understanding.     Patient's daughter states that the patient has not had any syncopal episodes since 2023.

## 2023-02-28 ENCOUNTER — Encounter (HOSPITAL_COMMUNITY): Payer: Self-pay | Admitting: Orthopedic Surgery

## 2023-02-28 ENCOUNTER — Ambulatory Visit (HOSPITAL_COMMUNITY): Payer: Medicare HMO

## 2023-02-28 ENCOUNTER — Other Ambulatory Visit: Payer: Self-pay

## 2023-02-28 ENCOUNTER — Encounter (HOSPITAL_COMMUNITY)
Admission: RE | Disposition: A | Discharge status: Home / Self Care | Payer: Self-pay | Source: Home / Self Care | Attending: Orthopedic Surgery

## 2023-02-28 ENCOUNTER — Ambulatory Visit (HOSPITAL_COMMUNITY): Payer: Self-pay | Admitting: Physician Assistant

## 2023-02-28 ENCOUNTER — Ambulatory Visit (HOSPITAL_COMMUNITY)
Admission: RE | Admit: 2023-02-28 | Discharge: 2023-02-28 | Disposition: A | Discharge status: Home / Self Care | Payer: Medicare HMO | Attending: Orthopedic Surgery | Admitting: Orthopedic Surgery

## 2023-02-28 DIAGNOSIS — S52572A Other intraarticular fracture of lower end of left radius, initial encounter for closed fracture: Secondary | ICD-10-CM | POA: Insufficient documentation

## 2023-02-28 DIAGNOSIS — F32A Depression, unspecified: Secondary | ICD-10-CM | POA: Insufficient documentation

## 2023-02-28 DIAGNOSIS — W1830XA Fall on same level, unspecified, initial encounter: Secondary | ICD-10-CM | POA: Insufficient documentation

## 2023-02-28 DIAGNOSIS — F419 Anxiety disorder, unspecified: Secondary | ICD-10-CM | POA: Insufficient documentation

## 2023-02-28 DIAGNOSIS — S52612A Displaced fracture of left ulna styloid process, initial encounter for closed fracture: Secondary | ICD-10-CM

## 2023-02-28 DIAGNOSIS — M069 Rheumatoid arthritis, unspecified: Secondary | ICD-10-CM | POA: Insufficient documentation

## 2023-02-28 HISTORY — DX: Age-related osteoporosis without current pathological fracture: M81.0

## 2023-02-28 HISTORY — DX: Nausea with vomiting, unspecified: R11.2

## 2023-02-28 HISTORY — PX: OPEN REDUCTION INTERNAL FIXATION (ORIF) DISTAL RADIAL FRACTURE: SHX5989

## 2023-02-28 HISTORY — DX: Other specified postprocedural states: Z98.890

## 2023-02-28 LAB — CBC
HCT: 36.2 % (ref 36.0–46.0)
Hemoglobin: 12 g/dL (ref 12.0–15.0)
MCH: 33.1 pg (ref 26.0–34.0)
MCHC: 33.1 g/dL (ref 30.0–36.0)
MCV: 100 fL (ref 80.0–100.0)
Platelets: 229 10*3/uL (ref 150–400)
RBC: 3.62 MIL/uL — ABNORMAL LOW (ref 3.87–5.11)
RDW: 15 % (ref 11.5–15.5)
WBC: 7.8 10*3/uL (ref 4.0–10.5)
nRBC: 0 % (ref 0.0–0.2)

## 2023-02-28 SURGERY — OPEN REDUCTION INTERNAL FIXATION (ORIF) DISTAL RADIUS FRACTURE
Anesthesia: Regional | Laterality: Left

## 2023-02-28 MED ORDER — ACETAMINOPHEN 500 MG PO TABS
ORAL_TABLET | ORAL | Status: AC
  Filled 2023-02-28: qty 2

## 2023-02-28 MED ORDER — FENTANYL CITRATE (PF) 100 MCG/2ML IJ SOLN: 25.0000 ug | INTRAMUSCULAR | Status: DC | PRN

## 2023-02-28 MED ORDER — 0.9 % SODIUM CHLORIDE (POUR BTL) OPTIME
TOPICAL | Status: DC | PRN
  Administered 2023-02-28: 1000 mL

## 2023-02-28 MED ORDER — DEXAMETHASONE SODIUM PHOSPHATE 10 MG/ML IJ SOLN
INTRAMUSCULAR | Status: DC | PRN
  Administered 2023-02-28: 10 mg via INTRAVENOUS

## 2023-02-28 MED ORDER — CHLORHEXIDINE GLUCONATE 0.12 % MT SOLN
15.0000 mL | Freq: Once | OROMUCOSAL | Status: AC
Start: 2023-02-28 — End: 2023-02-28

## 2023-02-28 MED ORDER — ONDANSETRON HCL 4 MG/2ML IJ SOLN
INTRAMUSCULAR | Status: DC | PRN
  Administered 2023-02-28: 4 mg via INTRAVENOUS

## 2023-02-28 MED ORDER — MIDAZOLAM HCL 2 MG/2ML IJ SOLN: 1.0000 mg | Freq: Once | INTRAMUSCULAR | Status: AC

## 2023-02-28 MED ORDER — CHLORHEXIDINE GLUCONATE 0.12 % MT SOLN
OROMUCOSAL | Status: AC
  Administered 2023-02-28: 15 mL via OROMUCOSAL
  Filled 2023-02-28: qty 15

## 2023-02-28 MED ORDER — EPHEDRINE SULFATE-NACL 50-0.9 MG/10ML-% IV SOSY
PREFILLED_SYRINGE | INTRAVENOUS | Status: DC | PRN
  Administered 2023-02-28: 5 mg via INTRAVENOUS
  Administered 2023-02-28 (×2): 10 mg via INTRAVENOUS

## 2023-02-28 MED ORDER — DEXAMETHASONE SODIUM PHOSPHATE 10 MG/ML IJ SOLN
INTRAMUSCULAR | Status: AC
  Filled 2023-02-28: qty 1

## 2023-02-28 MED ORDER — LIDOCAINE 2% (20 MG/ML) 5 ML SYRINGE
INTRAMUSCULAR | Status: DC | PRN
  Administered 2023-02-28: 20 mg via INTRAVENOUS

## 2023-02-28 MED ORDER — HYDROCODONE-ACETAMINOPHEN 5-325 MG PO TABS: 1.0000 | ORAL_TABLET | ORAL | 0 refills | Status: AC | PRN

## 2023-02-28 MED ORDER — CEFAZOLIN SODIUM-DEXTROSE 2-4 GM/100ML-% IV SOLN
2.0000 g | INTRAVENOUS | Status: AC
  Administered 2023-02-28: 2 g via INTRAVENOUS

## 2023-02-28 MED ORDER — ORAL CARE MOUTH RINSE: 15.0000 mL | Freq: Once | OROMUCOSAL | Status: AC

## 2023-02-28 MED ORDER — FENTANYL CITRATE (PF) 100 MCG/2ML IJ SOLN: 50.0000 ug | Freq: Once | INTRAMUSCULAR | Status: AC

## 2023-02-28 MED ORDER — FENTANYL CITRATE (PF) 100 MCG/2ML IJ SOLN
INTRAMUSCULAR | Status: AC
  Administered 2023-02-28: 50 ug via INTRAVENOUS
  Filled 2023-02-28: qty 2

## 2023-02-28 MED ORDER — CEFAZOLIN SODIUM-DEXTROSE 2-4 GM/100ML-% IV SOLN
INTRAVENOUS | Status: AC
  Filled 2023-02-28: qty 100

## 2023-02-28 MED ORDER — ONDANSETRON HCL 4 MG/2ML IJ SOLN: 4.0000 mg | Freq: Once | INTRAMUSCULAR | Status: DC | PRN

## 2023-02-28 MED ORDER — PROPOFOL 10 MG/ML IV BOLUS
INTRAVENOUS | Status: AC
  Filled 2023-02-28: qty 20

## 2023-02-28 MED ORDER — EPHEDRINE 5 MG/ML INJ
INTRAVENOUS | Status: AC
  Filled 2023-02-28: qty 5

## 2023-02-28 MED ORDER — ROPIVACAINE HCL 5 MG/ML IJ SOLN
INTRAMUSCULAR | Status: DC | PRN
  Administered 2023-02-28: 30 mL via PERINEURAL

## 2023-02-28 MED ORDER — PROPOFOL 500 MG/50ML IV EMUL
INTRAVENOUS | Status: DC | PRN
  Administered 2023-02-28: 80 ug/kg/min via INTRAVENOUS

## 2023-02-28 MED ORDER — ONDANSETRON HCL 4 MG/2ML IJ SOLN
INTRAMUSCULAR | Status: AC
  Filled 2023-02-28: qty 2

## 2023-02-28 MED ORDER — PHENYLEPHRINE HCL-NACL 20-0.9 MG/250ML-% IV SOLN
INTRAVENOUS | Status: DC | PRN
  Administered 2023-02-28: 80 ug via INTRAVENOUS

## 2023-02-28 MED ORDER — MIDAZOLAM HCL 2 MG/2ML IJ SOLN
INTRAMUSCULAR | Status: AC
  Administered 2023-02-28: 1 mg via INTRAVENOUS
  Filled 2023-02-28: qty 2

## 2023-02-28 MED ORDER — PROPOFOL 10 MG/ML IV BOLUS
INTRAVENOUS | Status: DC | PRN
  Administered 2023-02-28: 20 mg via INTRAVENOUS

## 2023-02-28 MED ORDER — ACETAMINOPHEN 500 MG PO TABS: 1000.0000 mg | ORAL_TABLET | Freq: Once | ORAL | Status: DC

## 2023-02-28 MED ORDER — LACTATED RINGERS IV SOLN: INTRAVENOUS | Status: DC

## 2023-02-28 MED ORDER — CLONIDINE HCL (ANALGESIA) 100 MCG/ML EP SOLN
EPIDURAL | Status: DC | PRN
  Administered 2023-02-28: 50 ug

## 2023-02-28 SURGICAL SUPPLY — 68 items
BAG COUNTER SPONGE SURGICOUNT (BAG) ×1 IMPLANT
BIT DRILL SOLID 2.0X40MM (BIT) IMPLANT
BIT DRILL SOLID 2.5X40MM (BIT) IMPLANT
BLADE CLIPPER SURG (BLADE) IMPLANT
BNDG ELASTIC 3INX 5YD STR LF (GAUZE/BANDAGES/DRESSINGS) ×3 IMPLANT
BNDG ELASTIC 3X5.8 VLCR NS LF (GAUZE/BANDAGES/DRESSINGS) IMPLANT
BNDG ELASTIC 4X5.8 VLCR NS LF (GAUZE/BANDAGES/DRESSINGS) IMPLANT
BNDG ELASTIC 4X5.8 VLCR STR LF (GAUZE/BANDAGES/DRESSINGS) ×1 IMPLANT
BNDG ESMARK 4X9 LF (GAUZE/BANDAGES/DRESSINGS) ×1 IMPLANT
BNDG GAUZE DERMACEA FLUFF 4 (GAUZE/BANDAGES/DRESSINGS) ×3 IMPLANT
BONE CANC CHIPS 20CC PCAN1/4 (Bone Implant) ×1 IMPLANT
CORD BIPOLAR FORCEPS 12FT (ELECTRODE) ×1 IMPLANT
COVER SURGICAL LIGHT HANDLE (MISCELLANEOUS) ×1 IMPLANT
CUFF TOURN SGL QUICK 18X4 (TOURNIQUET CUFF) ×1 IMPLANT
CUFF TRNQT CYL 24X4X16.5-23 (TOURNIQUET CUFF) IMPLANT
DRAIN TLS ROUND 10FR (DRAIN) IMPLANT
DRAPE OEC MINIVIEW 54X84 (DRAPES) IMPLANT
DRAPE U-SHAPE 47X51 STRL (DRAPES) ×1 IMPLANT
DRILL SOLID 2.0X40MM (BIT) ×1
DRILL SOLID 2.5X40MM (BIT) ×1
DRSG ADAPTIC 3X8 NADH LF (GAUZE/BANDAGES/DRESSINGS) ×1 IMPLANT
DRSG XEROFORM 1X8 (GAUZE/BANDAGES/DRESSINGS) IMPLANT
GAUZE SPONGE 4X4 12PLY STRL (GAUZE/BANDAGES/DRESSINGS) ×1 IMPLANT
GAUZE XEROFORM 5X9 LF (GAUZE/BANDAGES/DRESSINGS) ×1 IMPLANT
GLOVE BIOGEL M 8.0 STRL (GLOVE) ×1 IMPLANT
GLOVE SS BIOGEL STRL SZ 8 (GLOVE) ×1 IMPLANT
GOWN STRL REUS W/ TWL LRG LVL3 (GOWN DISPOSABLE) ×3 IMPLANT
GOWN STRL REUS W/ TWL XL LVL3 (GOWN DISPOSABLE) ×3 IMPLANT
GRAFT BNE CANC CHIPS 1-8 20CC (Bone Implant) IMPLANT
GUIDE AIMING 1.5MM (WIRE) IMPLANT
KIT BASIN OR (CUSTOM PROCEDURE TRAY) ×1 IMPLANT
KIT TURNOVER KIT B (KITS) ×1 IMPLANT
MANIFOLD NEPTUNE II (INSTRUMENTS) ×1 IMPLANT
NDL 22X1.5 STRL (OR ONLY) (MISCELLANEOUS) IMPLANT
NEEDLE 22X1.5 STRL (OR ONLY) (MISCELLANEOUS)
NS IRRIG 1000ML POUR BTL (IV SOLUTION) ×1 IMPLANT
PACK ORTHO EXTREMITY (CUSTOM PROCEDURE TRAY) ×1 IMPLANT
PAD ARMBOARD 7.5X6 YLW CONV (MISCELLANEOUS) ×2 IMPLANT
PAD CAST 4YDX4 CTTN HI CHSV (CAST SUPPLIES) ×3 IMPLANT
PADDING CAST ABS COTTON 3X4 (CAST SUPPLIES) IMPLANT
PADDING CAST ABS COTTON 4X4 ST (CAST SUPPLIES) IMPLANT
PEG GEMINUS SMOOTH LOCK 2.0X19 (Peg) IMPLANT
PEG SMOOTH LOCK 2.0X18 (Screw) IMPLANT
PLATE STD 3H LEFT (Plate) IMPLANT
SCREW CORT LOCK 3.5X10 TI (Screw) IMPLANT
SCREW GEMINUS CORT LOCK 3.5X11 (Screw) IMPLANT
SCREW GEMINUS PALS 2.5X18 (Screw) IMPLANT
SCREW POLY NON LOCK 3.5MMX12MM (Screw) IMPLANT
SCREWDRIVER SURG ST 2 (INSTRUMENTS) IMPLANT
SOL PREP POV-IOD 4OZ 10% (MISCELLANEOUS) ×2 IMPLANT
SPIKE FLUID TRANSFER (MISCELLANEOUS) IMPLANT
SPLINT FIBERGLASS 4X30 (CAST SUPPLIES) IMPLANT
SPONGE T-LAP 4X18 ~~LOC~~+RFID (SPONGE) IMPLANT
SUT ETHILON 4 0 PS 2 18 (SUTURE) IMPLANT
SUT MNCRL AB 4-0 PS2 18 (SUTURE) ×1 IMPLANT
SUT PROLENE 3 0 PS 2 (SUTURE) IMPLANT
SUT PROLENE 4 0 PS 2 18 (SUTURE) IMPLANT
SUT VIC AB 3-0 FS2 27 (SUTURE) IMPLANT
SYR CONTROL 10ML LL (SYRINGE) IMPLANT
SYSTEM CHEST DRAIN TLS 7FR (DRAIN) IMPLANT
TOWEL GREEN STERILE (TOWEL DISPOSABLE) ×1 IMPLANT
TOWEL GREEN STERILE FF (TOWEL DISPOSABLE) ×1 IMPLANT
TUBE CONNECTING 12X1/4 (SUCTIONS) ×1 IMPLANT
TUBE EVACUATION TLS (MISCELLANEOUS) ×1 IMPLANT
UNDERPAD 30X36 HEAVY ABSORB (UNDERPADS AND DIAPERS) ×1 IMPLANT
WATER STERILE IRR 1000ML POUR (IV SOLUTION) ×1 IMPLANT
WIRE FIX 1.5 STANDARD TIP (WIRE) ×3
WIRE FIX 1.5 STD TIP (WIRE) IMPLANT

## 2023-02-28 NOTE — Anesthesia Postprocedure Evaluation (Signed)
Anesthesia Post Note  Patient: Jalana Barg  Procedure(s) Performed: OPEN REDUCTION INTERNAL FIXATION (ORIF) DISTAL RADIUS FRACTURE (Left)     Patient location during evaluation: PACU Anesthesia Type: Regional Level of consciousness: awake and alert Pain management: pain level controlled Vital Signs Assessment: post-procedure vital signs reviewed and stable Respiratory status: spontaneous breathing, nonlabored ventilation, respiratory function stable and patient connected to nasal cannula oxygen Cardiovascular status: stable and blood pressure returned to baseline Postop Assessment: no apparent nausea or vomiting Anesthetic complications: no  There were no known notable events for this encounter.  Last Vitals:  Vitals:   02/28/23 1830 02/28/23 1845  BP: (!) 105/48 (!) 108/49  Pulse: 72 71  Resp: 14 15  Temp:  36.6 C  SpO2: 97% 99%    Last Pain:  Vitals:   02/28/23 1845  TempSrc:   PainSc: 0-No pain                 Shelton Silvas

## 2023-02-28 NOTE — Anesthesia Procedure Notes (Addendum)
Anesthesia Regional Block: Supraclavicular block   Pre-Anesthetic Checklist: , timeout performed,  Correct Patient, Correct Site, Correct Laterality,  Correct Procedure, Correct Position, site marked,  Risks and benefits discussed,  Surgical consent,  Pre-op evaluation,  At surgeon's request and post-op pain management  Laterality: Left  Prep: chloraprep       Needles:  Injection technique: Single-shot  Needle Type: Echogenic Needle     Needle Length: 9cm  Needle Gauge: 21     Additional Needles:   Procedures:,,,, ultrasound used (permanent image in chart),,    Narrative:  Start time: 02/28/2023 2:31 PM End time: 02/28/2023 2:41 PM Injection made incrementally with aspirations every 5 mL.  Performed by: Personally  Anesthesiologist: Collene Schlichter, MD  Additional Notes: No pain on injection. No increased resistance to injection. Injection made in 5cc increments.  Good needle visualization.  Patient tolerated procedure well.

## 2023-02-28 NOTE — H&P (Signed)
HAND SURGERY   HPI: Patient is a 73 y.o. female who presents with a closed, left, intra-articular distal radius fracture after fall while out of town last weekend.  She presents today for surgical management of her fracture.  Patient denies any changes to their medical history or new systemic symptoms today.    Past Medical History:  Diagnosis Date   Dyslipidemia    Osteoporosis    PONV (postoperative nausea and vomiting)    Rheumatoid arthritis (HCC)    Past Surgical History:  Procedure Laterality Date   BACK SURGERY     BUNIONECTOMY     CHOLECYSTECTOMY     FACIAL COSMETIC SURGERY     HYSTEROTOMY     full   Social History   Socioeconomic History   Marital status: Divorced    Spouse name: Not on file   Number of children: 4   Years of education: Not on file   Highest education level: Not on file  Occupational History   Not on file  Tobacco Use   Smoking status: Never   Smokeless tobacco: Never  Substance and Sexual Activity   Alcohol use: Not Currently    Comment: rarely wine   Drug use: Never   Sexual activity: Not on file  Other Topics Concern   Not on file  Social History Narrative   Right handed   Social Drivers of Health   Financial Resource Strain: Patient Declined (10/02/2022)   Received from [facility]   Overall Financial Resource Strain (CARDIA)    Difficulty of Paying Living Expenses: Patient declined  Food Insecurity: No Food Insecurity (10/02/2022)   Received from [facility]   Hunger Vital Sign    Worried About Running Out of Food in the Last Year: Never true    Ran Out of Food in the Last Year: Never true  Transportation Needs: No Transportation Needs (10/02/2022)   Received from [facility] - Transportation    Lack of Transportation (Medical): No    Lack of Transportation (Non-Medical): No  Physical Activity: Insufficiently Active (10/02/2022)   Received from [facility]   Exercise Vital Sign    Days of Exercise per Week: 2  days    Minutes of Exercise per Session: 60 min  Stress: Stress Concern Present (10/02/2022)   Received from [facility] - Occupational Stress Questionnaire    Feeling of Stress : To some extent  Social Connections: Moderately Integrated (10/02/2022)   Received from [facility]   Social Network    How would you rate your social network (family, work, friends)?: Adequate participation with social networks   Family History  Adopted: Yes  Problem Relation Age of Onset   Cancer Father    - negative except otherwise stated in the family history section Allergies  Allergen Reactions   Doxycycline Nausea And Vomiting   Oxycodone Nausea And Vomiting   Oxycodone-Acetaminophen Nausea And Vomiting    Nausea with vomiting and hives to stomach.    Prior to Admission medications   Medication Sig Start Date End Date Taking? Authorizing Provider  acetaminophen (TYLENOL) 325 MG tablet Take 650 mg by mouth every 6 (six) hours as needed for moderate pain or headache.   Yes [provider]  Ascorbic Acid (VITAMIN C) 1000 MG tablet Take 1,000 mg by mouth daily.   Yes [provider]  aspirin EC 81 MG tablet Take 81 mg by mouth daily. Swallow whole.  Yes [provider]  Calcium Carb-Cholecalciferol (CALCIUM + VITAMIN D3 PO) Take 1 tablet by mouth daily.   Yes [provider]  ENBREL SURECLICK 50 MG/ML injection Inject 50 mg into the skin once a week.   Yes [provider]  ezetimibe (ZETIA) 10 MG tablet Take 1 tablet (10 mg total) by mouth daily. 07/13/22  Yes Rollene Rotunda, MD  ferrous sulfate 325 (65 FE) MG tablet Take 325 mg by mouth once a week.   Yes [provider]  FLUoxetine (PROZAC) 20 MG capsule Take 20 mg by mouth daily.   Yes [provider]  folic acid (FOLVITE) 1 MG tablet Take 1 mg by mouth daily.   Yes [provider]  Glucosamine HCl 1500 MG TABS Take 1,500 mg by mouth  daily.   Yes [provider]  Magnesium 250 MG TABS Take 250 mg by mouth daily.   Yes [provider]  methotrexate (RHEUMATREX) 2.5 MG tablet Take 15 mg by mouth every Wednesday. 02/10/23  Yes [provider]  Multiple Vitamin (MULTIVITAMIN) capsule Take 1 capsule by mouth daily.   Yes [provider]  Omega-3 Fatty Acids (FISH OIL) 1200 MG CAPS Take 1,200 mg by mouth daily.   Yes [provider]  RESVERATROL PO Take 1,450 mg by mouth daily.   Yes [provider]  rosuvastatin (CRESTOR) 40 MG tablet Take 1 tablet (40 mg total) by mouth daily. PT. MUST MAKE AN APPOINTMENT IN ORDER TO RECEIVE FUTURE REFILLS. SECOND ATTEMPT. 02/05/23  Yes Rollene Rotunda, MD  Vitamin A 2400 MCG (8000 UT) CAPS Take 8,000 Units by mouth 3 (three) times a week.   Yes [provider]  vitamin E 180 MG (400 UNITS) capsule Take 400 Units by mouth 3 (three) times a week.   Yes [provider]  zinc gluconate 50 MG tablet Take 50 mg by mouth once a week.   Yes [provider]  ibuprofen (ADVIL) 800 MG tablet Take 1 tablet (800 mg total) by mouth 3 (three) times daily. 09/09/20   O'NealRonnald Ramp, MD   No results found. - Positive ROS: All other systems have been reviewed and were otherwise negative with the exception of those mentioned in the HPI and as above.  Physical Exam: General: No acute distress, resting comfortably Cardiovascular: BUE warm and well perfused, normal rate Respiratory: Normal WOB on RA Skin: Warm and dry Neurologic: Sensation intact distally Psychiatric: Patient is at baseline mood and affect  Left Upper Extremity  Sugartong splint is clean and dry.  Limited AROM of her fingers secondary to pain and swelling.  SILT m/u/r distribution.  Her hand is warm and well perfused w/ BCR.   Assessment: 73 yo F w/ closed, left intra-articular distal radius fracture after a ground level fall.   Plan: OR today for ORIF  left distal radius. We again reviewed the risks of surgery which include, but are not limited to, bleeding, infection, damage to neurovascular structures, persistent symptoms, malunion, nonunion, wrist or finger stiffness, delayed wound healing, need for additional surgery.  Informed consent was signed.  All questions were answered.   Marlyne Beards, M.D. [facility] 1:29 PM

## 2023-02-28 NOTE — Interval H&P Note (Signed)
History and Physical Interval Note:  02/28/2023 1:32 PM  Heather Mills  has presented today for surgery, with the diagnosis of left distal radius fracture.  The various methods of treatment have been discussed with the patient and family. After consideration of risks, benefits and other options for treatment, the patient has consented to  Procedure(s) with comments: OPEN REDUCTION INTERNAL FIXATION (ORIF) DISTAL RADIUS FRACTURE (Left) - vs general with regional block as a surgical intervention.  The patient's history has been reviewed, patient examined, no change in status, stable for surgery.  I have reviewed the patient's chart and labs.  Questions were answered to the patient's satisfaction.     Lorik Guo

## 2023-02-28 NOTE — Op Note (Signed)
Date of Surgery: 02/28/2023  INDICATIONS: Patient is a 73 y.o.-year-old female with a closed, left intra-articular distal radius fracture after a ground level fall over the weekend.  She presents today for surgical management of her fracture.  Risks, benefits, and alternatives to surgery were again discussed with the patient in the preoperative area. The patient wishes to proceed with surgery.  Informed consent was signed after our discussion.   PREOPERATIVE DIAGNOSIS:  Closed, left, intra-articular distal radius fracture, >3 fragments Closed left ulnar styloid fracture  POSTOPERATIVE DIAGNOSIS: Same.  PROCEDURE: Open reduction and internal fixation of left, intra-articular distal radius fracture, >3 fragments Closed treatment of left ulnar styloid fracture   SURGEON: Waylan Rocher, M.D.  ASSIST:   ANESTHESIA:  Regional, MAC  IV FLUIDS AND URINE: See anesthesia.  ESTIMATED BLOOD LOSS: 20 mL.  IMPLANTS:  Implant Name Type Inv. Item Serial No. Manufacturer Lot No. LRB No. Used Action  PLATE STD 3H LEFT - [id number] Plate PLATE STD 3H LEFT  SKELETAL DYNAMICS  Left 1 Implanted  SCREW POLY NON LOCK 3.0JWJ19JY - [id number] Screw SCREW POLY NON LOCK 3.8MVH84ON  SKELETAL DYNAMICS  Left 1 Implanted  PEG SMOOTH LOCK 2.0X18 - [id number] Screw PEG SMOOTH LOCK 2.0X18  SKELETAL DYNAMICS  Left 5 Implanted  PEG GEMINUS SMOOTH LOCK 2.0X19 - [id number] Peg PEG GEMINUS SMOOTH LOCK 2.0X19  SKELETAL DYNAMICS  Left 1 Implanted  SCREW GEMINUS PALS 2.5X18 - [id number] Screw SCREW GEMINUS PALS 2.5X18  SKELETAL DYNAMICS  Left 1 Implanted  SCREW CORT LOCK 3.5X10 TI - [id number] Screw SCREW CORT LOCK 3.5X10 TI  SKELETAL DYNAMICS  Left 1 Implanted  SCREW GEMINUS CORT LOCK 3.5X11 - [id number] Screw SCREW GEMINUS CORT LOCK 3.5X11  SKELETAL DYNAMICS  Left 1 Implanted  BONE CANC CHIPS 20CC PCAN1/4 - [id number] Bone Implant BONE CANC CHIPS 20CC PCAN1/4 [id number] [facility]  Left 1 Implanted      DRAINS: None  COMPLICATIONS: None  DESCRIPTION OF PROCEDURE: The patient was met in the preoperative holding area where the surgical site was marked and the consent form was signed.  The patient was then taken to the operating room and transferred to the operating table.  All bony prominences were well padded.  A tourniquet was applied to the left upper arm.  Monitored sedation was induced.  The operative extremity was prepped and draped in the usual and sterile fashion.  A formal time-out was performed to confirm that this was the correct patient, surgery, side, and site.   Following formal timeout, a longitudinal incision was made directly overlying the flexor carpi radialis tendon.  The skin was incised.  Small crossing vessels were coagulated with bipolar cautery.  The volar aspect of the flexor carpi radialis tendon sheath was incised longitudinally.  The FCR tendon was retracted radially.  The floor of the FCR tendon sheath was similarly divided using a 15 blade scalpel.  Blunt dissection was used to develop Parona space between the flexor pollicis longus and pronator quadratus.  The flexor pollicis longus was retracted ulnarly.  An L-shaped tenotomy was performed along the distal and radial aspect of the pronator quadratus tendon.  The pronator quadratus was subperiosteally dissected off of the volar aspect of the radius and the distal fracture fragments using an elevator.  There was significant comminution of the volar metaphysis with a large fragment from the ulnar aspect of the metaphysis.  There was severe comminution of the radial aspect of metaphysis such that there was no large fragment to reduce.  X-rays demonstrated a volar shear fracture pattern of the articular surface in addition to a transverse fracture through the dorsal metaphysis.  Using a pickup and a Therapist, nutritional, I was able to reduce the larger volar and ulnar metaphyseal fragment into a reasonable position.  A longitudinal  traction brought the fracture it back out to length.  A 3 hole plate was chosen.  It was applied at the volar surface of the radius.  A 3.5 mm bicortical nonlocking screw was then placed in the center of the oblong hole of the plate.  With longitudinal traction and flexion of the wrist, the fracture was appropriately reduced.  The free ulnar metaphyseal fragment appeared to be in appropriate position.  2 K wires were placed in the aiming guides of the plate to maintain the reduction.  AP view showed that the fracture was back out to length.  A lateral view showed neutral tilt.  The guidewires were in a good, subchondral position.  At this point, the distal holes in the plate were filled with smooth locking pegs taking care that the pegs did not penetrate the dorsal cortex.  A freehand locking screw was placed in the radial most aspect of the hole into the radial styloid.  Care was again taken at the screw did not penetrate the dorsal cortex.  The 2 remaining holes in the shaft of the plate were then filled with bicortical locking screws.  AP fluoroscopy showed that the radial height and inclination was maintained.  The DRUJ appeared to be congruent.  She also had smooth pronation and supination of the forearm without crepitus or mechanical block to motion.  The lateral view showed that there was neutral tilt and that the articular surface appeared to be congruent without step-off or gapping.  All of the screws appeared to be in a good, subchondral location.  The DRUJ was then examined in neutral, supination, and pronation with no gross instability.  Given the severe osteoporosis and metaphyseal bone loss, the large defect was filled with allograft cancellous bone chips.  The wound was then thoroughly irrigated with copious sterile saline.  The skin was closed first using a 4-0 Monocryl suture in buried erupted fashion.  The tourniquet was then deflated.  Hemostasis was achieved with direct pressure over the wound.   All fingers were pink and well-perfused.  The skin was then closed using a 4-0 nylon suture in horizontal mattress fashion.  The wound was then cleaned and dressed with Xeroform, folded Kerlix, cast padding, and a well-padded volar splint was applied.   The patient was reversed from sedation.  She was transferred from the operating table to the postoperative bed.  All counts were correct x 2 at the end of the procedure.  The patient was then taken to the PACU in stable condition.   POSTOPERATIVE PLAN: She will be discharged home with appropriate pain medication and discharge instructions.  I will see her back in the office in 10 to 14 days for her first postop visit.  We will get repeat x-rays of the wrist out of the splint.  Waylan Rocher, MD 6:11 PM

## 2023-02-28 NOTE — Discharge Instructions (Signed)
Waylan Rocher, M.D. Hand Surgery  POST-OPERATIVE DISCHARGE INSTRUCTIONS   PRESCRIPTIONS: You may have been given a prescription to be taken as directed for post-operative pain control.  You may also take over the counter ibuprofen/aleve and tylenol for pain. Take this as directed on the packaging. Do not exceed 3000 mg tylenol/acetaminophen in 24 hours.  Ibuprofen 600-800 mg (3-4) tablets by mouth every 6 hours as needed for pain.  OR Aleve 2 tablets by mouth every 12 hours (twice daily) as needed for pain.  AND/OR Tylenol 1000 mg (2 tablets) every 8 hours as needed for pain.  Please use your pain medication carefully, as refills are limited and you may not be provided with one.  As stated above, please use over the counter pain medicine - it will also be helpful with decreasing your swelling.    ANESTHESIA: After your surgery, post-surgical discomfort or pain is likely. This discomfort can last several days to a few weeks. At certain times of the day your discomfort may be more intense.   Did you receive a nerve block?  A nerve block can provide pain relief for one hour to two days after your surgery. As long as the nerve block is working, you will experience little or no sensation in the area the surgeon operated on.  As the nerve block wears off, you will begin to experience pain or discomfort. It is very important that you begin taking your prescribed pain medication before the nerve block fully wears off. Treating your pain at the first sign of the block wearing off will ensure your pain is better controlled and more tolerable when full-sensation returns. Do not wait until the pain is intolerable, as the medicine will be less effective. It is better to treat pain in advance than to try and catch up.   General Anesthesia:  If you did not receive a nerve block during your surgery, you will need to start taking your pain medication shortly after your surgery and should continue  to do so as prescribed by your surgeon.     ICE AND ELEVATION: You may use ice for the first 48-72 hours, but it is not critical.   Motion of your fingers is very important to decrease the swelling.  Elevation, as much as possible for the next 48 hours, is critical for decreasing swelling as well as for pain relief. Elevation means when you are seated or lying down, you hand should be at or above your heart. When walking, the hand needs to be at or above the level of your elbow.  If the bandage gets too tight, it may need to be loosened. Please contact our office and we will instruct you in how to do this.    SURGICAL BANDAGES:  Keep your dressing and/or splint clean and dry at all times.  Do not remove until you are seen again in the office.  If careful, you may place a plastic bag over your bandage and tape the end to shower, but be careful, do not get your bandages wet.     HAND THERAPY:  You may not need any. If you do, we will begin this at your follow up visit in the clinic.    ACTIVITY AND WORK: You are encouraged to move any fingers which are not in the bandage.  Light use of the fingers is allowed to assist the other hand with daily hygiene and eating, but strong gripping or lifting is often uncomfortable and  should be avoided.  You might miss a variable period of time from work and hopefully this issue has been discussed prior to surgery. You may not do any heavy work with your affected hand for about 2 weeks.    [facility] Second Floor, [address] [place], [place] [address] [phone]

## 2023-02-28 NOTE — Transfer of Care (Signed)
Immediate Anesthesia Transfer of Care Note  Patient: Heather Mills  Procedure(s) Performed: OPEN REDUCTION INTERNAL FIXATION (ORIF) DISTAL RADIUS FRACTURE (Left)  Patient Location: PACU  Anesthesia Type:MAC  Level of Consciousness: awake, alert , oriented, patient cooperative, and responds to stimulation  Airway & Oxygen Therapy: Patient Spontanous Breathing  Post-op Assessment: Report given to RN and Post -op Vital signs reviewed and stable  Post vital signs: Reviewed and stable  Last Vitals:  Vitals Value Taken Time  BP 106/53 02/28/23 1809  Temp 36.6 C 02/28/23 1809  Pulse 73 02/28/23 1810  Resp 16 02/28/23 1810  SpO2 97 % 02/28/23 1810  Vitals shown include unfiled device data.  Last Pain:  Vitals:   02/28/23 1809  TempSrc:   PainSc: 0-No pain         Complications: No notable events documented.

## 2023-03-01 ENCOUNTER — Encounter (HOSPITAL_COMMUNITY): Payer: Self-pay | Admitting: Orthopedic Surgery

## 2023-03-04 ENCOUNTER — Telehealth: Payer: Self-pay | Admitting: Cardiology

## 2023-03-04 ENCOUNTER — Other Ambulatory Visit: Payer: Self-pay | Admitting: Cardiology

## 2023-03-04 MED ORDER — ROSUVASTATIN CALCIUM 40 MG PO TABS: 40.0000 mg | ORAL_TABLET | Freq: Every day | ORAL | 0 refills | Status: DC

## 2023-03-04 NOTE — Telephone Encounter (Signed)
Called and spoke to patient's daughter. Verified patient's name and DOB. She asked if patient is still on Crestor because patient is out of medication. Advised her patient is currently on medication but has not been seen since 09/18/21. He was due to have a 1 month follow up and never scheduled. Patient was scheduled for Dr Antoine Poche for 05/02/23 at 2:00 pm. Refill sent to [facility] pharmacy for 60 with no refill. Advised patient must keep that appt for further refill.

## 2023-03-04 NOTE — Telephone Encounter (Signed)
Pt daughter called in asking if she should still be taking Rosuvastatin, please advise.

## 2023-03-11 ENCOUNTER — Ambulatory Visit: Payer: Medicare HMO | Admitting: Adult Health

## 2023-04-27 ENCOUNTER — Other Ambulatory Visit: Payer: Self-pay | Admitting: Cardiology

## 2023-04-30 NOTE — Progress Notes (Unsigned)
  Cardiology Office Note:   Date:  05/02/2023  ID:  Heather Mills, Heather Mills 12-23-1950, MRN [id number] PCP: Beam, Chales Salmon, MD  [facility] Providers Cardiologist:  Rollene Rotunda, MD {  History of Present Illness:   Heather Mills is a 73 y.o. female who presents for follow up of chest pain.  She was found to have LM less than 50% stenosis, LAD less than 50% stenosis and no plaque in the RCA.  FFR suggested no obstructive disease.  This was in 2022.    She presents now.  She had some shoulder pain and an injection that improved the situation.  However, several weeks after this discomfort she is having shoulder and back discomfort that she was not having previously.  This seems to happen with exertion.  She does get a little chest tightness at times.  The discomfort is 6 out of 10.  It does not seem to happen at rest.  It is not associated nausea vomiting or diaphoresis.  She is not having any new shortness of breath, PND or orthopnea.  She has had no new palpitations, presyncope or syncope.  She has not had any of the symptoms that she had previously when she had a loss of consciousness.  I used the app for [place] interpreter.  ROS: As stated in the HPI and negative for all other systems.  Studies Reviewed:    EKG:   EKG Interpretation Date/Time:  Thursday May 02 2023 14:46:08 EDT Ventricular Rate:  66 PR Interval:  176 QRS Duration:  70 QT Interval:  382 QTC Calculation: 400 R Axis:   -35  Text Interpretation: Normal sinus rhythm Left axis deviation Poor anterior R wave progression When compared with ECG of 26-Jul-2021 22:43, T wave inversion in V1 and V2 were  slightly more pronounced than previous. Confirmed by Rollene Rotunda ([id number]) on 05/02/2023 2:54:15 PM    Risk Assessment/Calculations:              Physical Exam:   VS:  BP 110/60 (BP Location: Left Arm, Patient Position: Sitting, Cuff Size: Normal)   Ht 5\' 2"  (1.575 m)   Wt 130 lb (59 kg)   LMP  (LMP Unknown)   BMI  23.78 kg/m    Wt Readings from Last 3 Encounters:  05/02/23 130 lb (59 kg)  02/28/23 125 lb (56.7 kg)  09/18/21 126 lb 12.8 oz (57.5 kg)     GEN: Well nourished, well developed in no acute distress NECK: No JVD; No carotid bruits CARDIAC: RRR, no murmurs, rubs, gallops RESPIRATORY:  Clear to auscultation without rales, wheezing or rhonchi  ABDOMEN: Soft, non-tender, non-distended EXTREMITIES:  No edema; No deformity   ASSESSMENT AND PLAN:   CHEST PAIN:    The chest pain has anginal is greater than nonanginal discomfort.  EKG is slightly abnormal but not diagnostic.  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  She needs further testing.  Like to stress testing but she would not be able to walk on a treadmill.  I will order PET scanning.   SYNCOPE:   She has had no further syncope.  No change in therapy.   DYSLIPIDEMIA:   LDL was 61 with an HDL of 65.  No change in therapy.     Follow up with me as needed based on the results of the above  Signed, Rollene Rotunda, MD

## 2023-05-02 ENCOUNTER — Encounter: Payer: Self-pay | Admitting: Cardiology

## 2023-05-02 ENCOUNTER — Ambulatory Visit: Payer: Medicare HMO | Attending: Cardiology | Admitting: Cardiology

## 2023-05-02 VITALS — BP 110/60 | Ht 62.0 in | Wt 130.0 lb

## 2023-05-02 DIAGNOSIS — I25119 Atherosclerotic heart disease of native coronary artery with unspecified angina pectoris: Secondary | ICD-10-CM

## 2023-05-02 DIAGNOSIS — R55 Syncope and collapse: Secondary | ICD-10-CM

## 2023-05-02 DIAGNOSIS — R072 Precordial pain: Secondary | ICD-10-CM | POA: Diagnosis not present

## 2023-05-02 DIAGNOSIS — E785 Hyperlipidemia, unspecified: Secondary | ICD-10-CM | POA: Diagnosis not present

## 2023-05-02 MED ORDER — ROSUVASTATIN CALCIUM 40 MG PO TABS: 40.0000 mg | ORAL_TABLET | Freq: Every day | ORAL | 3 refills | Status: DC

## 2023-05-02 MED ORDER — EZETIMIBE 10 MG PO TABS: 10.0000 mg | ORAL_TABLET | Freq: Every day | ORAL | 3 refills | Status: DC

## 2023-05-02 NOTE — Patient Instructions (Addendum)
 Medication Instructions:  No changes.  *If you need a refill on your cardiac medications before your next appointment, please call your pharmacy*  Testing/Procedures:    Please report to Radiology at the [facility] Main Entrance 30 minutes early for your test.  [address] [place], [place] [address]                        How to Prepare for Your Cardiac PET/CT Stress Test:  Nothing to eat or drink, except water, 3 hours prior to arrival time.  NO caffeine/decaffeinated products, or chocolate 12 hours prior to arrival. (Please note decaffeinated beverages (teas/coffees) still contain caffeine).  If you have caffeine within 12 hours prior, the test will need to be rescheduled.     You may take your remaining medications with water.  NO perfume, cologne or lotion on chest or abdomen area. FEMALES - Please avoid wearing dresses to this appointment.  Total time is 1 to 2 hours; you may want to bring reading material for the waiting time.  IF YOU THINK YOU MAY BE PREGNANT, OR ARE NURSING PLEASE INFORM THE TECHNOLOGIST.  In preparation for your appointment, medication and supplies will be purchased.  Appointment availability is limited, so if you need to cancel or reschedule, please call the Radiology Department Scheduler at [phone] 24 hours in advance to avoid a cancellation fee of $100.00  What to Expect When you Arrive:  Once you arrive and check in for your appointment, you will be taken to a preparation room within the Radiology Department.  A technologist or Nurse will obtain your medical history, verify that you are correctly prepped for the exam, and explain the procedure.  Afterwards, an IV will be started in your arm and electrodes will be placed on your skin for EKG monitoring during the stress portion of the exam. Then you will be escorted to the PET/CT scanner.  There, staff will get you positioned on the scanner and obtain a blood pressure and EKG.  During  the exam, you will continue to be connected to the EKG and blood pressure machines.  A small, safe amount of a radioactive tracer will be injected in your IV to obtain a series of pictures of your heart along with an injection of a stress agent.    After your Exam:  It is recommended that you eat a meal and drink a caffeinated beverage to counter act any effects of the stress agent.  Drink plenty of fluids for the remainder of the day and urinate frequently for the first couple of hours after the exam.  Your doctor will inform you of your test results within 7-10 business days.  For more information and frequently asked questions, please visit our website: [email or web address]  For questions about your test or how to prepare for your test, please call: Cardiac Imaging Nurse Navigators Office: [phone]   Follow-Up: At [facility], you and your health needs are our priority.  As part of our continuing mission to provide you with exceptional heart care, our providers are all part of one team.  This team includes your primary Cardiologist (physician) and Advanced Practice Providers or APPs (Physician Assistants and Nurse Practitioners) who all work together to provide you with the care you need, when you need it.  Your next appointment:    As needed.   Provider:   Rollene Rotunda MD     We recommend signing up for the  patient portal called "MyChart".  Sign up information is provided on this After Visit Summary.  MyChart is used to connect with patients for Virtual Visits (Telemedicine).  Patients are able to view lab/test results, encounter notes, upcoming appointments, etc.  Non-urgent messages can be sent to your provider as well.   To learn more about what you can do with MyChart, go to [email or web address].   Other Instructions       1st Floor: - Lobby - Registration  - Pharmacy  - Lab - Cafe  2nd Floor: - PV Lab - Diagnostic Testing (echo, CT,  nuclear med)  3rd Floor: - Vacant  4th Floor: - TCTS (cardiothoracic surgery) - AFib Clinic - Structural Heart Clinic - Vascular Surgery  - Vascular Ultrasound  5th Floor: - Heart[facility] Cardiology (general and EP) - Clinical Pharmacy for coumadin, hypertension, lipid, weight-loss medications, and med management appointments    Valet parking services will be available as well.

## 2023-05-02 NOTE — Addendum Note (Signed)
 Addended by: Jeannette How A on: 05/02/2023 03:04 PM   Modules accepted: Orders

## 2023-05-03 ENCOUNTER — Other Ambulatory Visit: Payer: Self-pay

## 2023-05-03 MED ORDER — ROSUVASTATIN CALCIUM 40 MG PO TABS: 40.0000 mg | ORAL_TABLET | Freq: Every day | ORAL | 3 refills | Status: AC

## 2023-07-27 ENCOUNTER — Encounter (HOSPITAL_COMMUNITY): Payer: Self-pay

## 2023-07-29 ENCOUNTER — Telehealth (HOSPITAL_COMMUNITY): Payer: Self-pay | Admitting: *Deleted

## 2023-07-29 NOTE — Telephone Encounter (Signed)
 Reaching out to patient to offer assistance regarding upcoming cardiac imaging study; pt's daughter answered phone and verbalizes understanding of appt date/time, parking situation and where to check in, pre-test NPO status and verified current allergies; name and call back number provided for further questions should they arise  Chantal Requena RN Navigator Cardiac Imaging [facility] [phone] office [phone] cell  Patient's daughter is aware her mother is to avoid caffeine 12 hours prior to her cardiac PET scan.

## 2023-07-30 ENCOUNTER — Encounter (HOSPITAL_COMMUNITY)
Admission: RE | Admit: 2023-07-30 | Discharge: 2023-07-30 | Disposition: A | Discharge status: Home / Self Care | Source: Ambulatory Visit | Attending: Cardiology | Admitting: Cardiology

## 2023-07-30 DIAGNOSIS — R072 Precordial pain: Secondary | ICD-10-CM | POA: Insufficient documentation

## 2023-07-30 LAB — NM PET CT CARDIAC PERFUSION MULTI W/ABSOLUTE BLOODFLOW
LV dias vol: 63 mL (ref 46–106)
LV sys vol: 25 mL (ref 3.8–5.2)
MBFR: 3.05
Nuc Rest EF: 60 %
Nuc Stress EF: 74 %
Peak HR: 101 {beats}/min
Rest HR: 68 {beats}/min
Rest MBF: 0.98 ml/g/min
Rest Nuclear Isotope Dose: 15.3 mCi
ST Depression (mm): 0 mm
Stress MBF: 2.99 ml/g/min
Stress Nuclear Isotope Dose: 15.3 mCi

## 2023-07-30 MED ORDER — REGADENOSON 0.4 MG/5ML IV SOLN
0.4000 mg | Freq: Once | INTRAVENOUS | Status: AC
  Administered 2023-07-30: 0.4 mg via INTRAVENOUS

## 2023-07-30 MED ORDER — REGADENOSON 0.4 MG/5ML IV SOLN
INTRAVENOUS | Status: AC
  Filled 2023-07-30: qty 5

## 2023-07-30 MED ORDER — RUBIDIUM RB82 GENERATOR (RUBYFILL)
15.3100 | PACK | Freq: Once | INTRAVENOUS | Status: AC
  Administered 2023-07-30: 15.31 via INTRAVENOUS

## 2023-07-30 MED ORDER — RUBIDIUM RB82 GENERATOR (RUBYFILL)
15.2900 | PACK | Freq: Once | INTRAVENOUS | Status: AC
  Administered 2023-07-30: 15.29 via INTRAVENOUS

## 2023-07-30 NOTE — Progress Notes (Signed)
 Pt. Tolerated lexi scan well.

## 2023-07-31 ENCOUNTER — Ambulatory Visit: Payer: Self-pay | Admitting: Cardiology

## 2023-08-05 NOTE — Telephone Encounter (Signed)
Pt is returning RN's call. Please advise

## 2023-09-04 ENCOUNTER — Encounter: Payer: Self-pay | Admitting: Student

## 2023-09-04 DIAGNOSIS — K7689 Other specified diseases of liver: Secondary | ICD-10-CM

## 2023-09-10 ENCOUNTER — Encounter: Payer: Self-pay | Admitting: Student

## 2023-09-19 ENCOUNTER — Other Ambulatory Visit: Payer: Self-pay | Admitting: Student

## 2023-09-19 DIAGNOSIS — K7689 Other specified diseases of liver: Secondary | ICD-10-CM

## 2023-09-19 DIAGNOSIS — R109 Unspecified abdominal pain: Secondary | ICD-10-CM

## 2023-09-23 ENCOUNTER — Encounter: Payer: Self-pay | Admitting: Student

## 2023-09-25 ENCOUNTER — Ambulatory Visit
Admission: RE | Admit: 2023-09-25 | Discharge: 2023-09-25 | Disposition: A | Discharge status: Home / Self Care | Source: Ambulatory Visit | Attending: Student | Admitting: Student

## 2023-09-25 ENCOUNTER — Encounter: Payer: Self-pay | Admitting: Radiology

## 2023-09-25 DIAGNOSIS — K7689 Other specified diseases of liver: Secondary | ICD-10-CM

## 2023-09-25 DIAGNOSIS — R109 Unspecified abdominal pain: Secondary | ICD-10-CM

## 2023-09-25 MED ORDER — IOPAMIDOL (ISOVUE-300) INJECTION 61%
100.0000 mL | Freq: Once | INTRAVENOUS | Status: AC | PRN
  Administered 2023-09-25: 100 mL via INTRAVENOUS

## 2023-10-07 ENCOUNTER — Other Ambulatory Visit: Payer: Self-pay | Admitting: Cardiology

## 2023-10-07 DIAGNOSIS — E785 Hyperlipidemia, unspecified: Secondary | ICD-10-CM

## 2024-01-03 ENCOUNTER — Encounter (HOSPITAL_COMMUNITY): Payer: Self-pay | Admitting: *Deleted

## 2024-01-03 ENCOUNTER — Emergency Department (HOSPITAL_COMMUNITY)
Admission: EM | Admit: 2024-01-03 | Discharge: 2024-01-04 | Disposition: A | Attending: Emergency Medicine | Admitting: Emergency Medicine

## 2024-01-03 ENCOUNTER — Other Ambulatory Visit: Payer: Self-pay

## 2024-01-03 DIAGNOSIS — M545 Low back pain, unspecified: Secondary | ICD-10-CM | POA: Insufficient documentation

## 2024-01-03 DIAGNOSIS — N390 Urinary tract infection, site not specified: Secondary | ICD-10-CM | POA: Insufficient documentation

## 2024-01-03 DIAGNOSIS — R42 Dizziness and giddiness: Secondary | ICD-10-CM | POA: Insufficient documentation

## 2024-01-03 DIAGNOSIS — Z7982 Long term (current) use of aspirin: Secondary | ICD-10-CM | POA: Insufficient documentation

## 2024-01-03 DIAGNOSIS — R112 Nausea with vomiting, unspecified: Secondary | ICD-10-CM | POA: Insufficient documentation

## 2024-01-03 DIAGNOSIS — B9689 Other specified bacterial agents as the cause of diseases classified elsewhere: Secondary | ICD-10-CM | POA: Insufficient documentation

## 2024-01-03 LAB — URINALYSIS, ROUTINE W REFLEX MICROSCOPIC
Bilirubin Urine: NEGATIVE
Glucose, UA: NEGATIVE mg/dL
Hgb urine dipstick: NEGATIVE
Ketones, ur: NEGATIVE mg/dL
Nitrite: NEGATIVE
Protein, ur: NEGATIVE mg/dL
Specific Gravity, Urine: 1.018 (ref 1.005–1.030)
pH: 6 (ref 5.0–8.0)

## 2024-01-03 LAB — CBC
HCT: 38.1 % (ref 36.0–46.0)
Hemoglobin: 12.3 g/dL (ref 12.0–15.0)
MCH: 32.4 pg (ref 26.0–34.0)
MCHC: 32.3 g/dL (ref 30.0–36.0)
MCV: 100.3 fL — ABNORMAL HIGH (ref 80.0–100.0)
Platelets: 210 K/uL (ref 150–400)
RBC: 3.8 MIL/uL — ABNORMAL LOW (ref 3.87–5.11)
RDW: 14.1 % (ref 11.5–15.5)
WBC: 9.1 K/uL (ref 4.0–10.5)
nRBC: 0 % (ref 0.0–0.2)

## 2024-01-03 MED ORDER — ONDANSETRON 4 MG PO TBDP
4.0000 mg | ORAL_TABLET | Freq: Once | ORAL | Status: AC | PRN
  Administered 2024-01-03: 4 mg via ORAL
  Filled 2024-01-03: qty 1

## 2024-01-03 NOTE — ED Notes (Signed)
 Lab tech notified triage RN that specimens need to be recollected , phlebotomist notified .

## 2024-01-03 NOTE — ED Triage Notes (Addendum)
 Patient here with complaints of hypertension, nausea, vomiting and dizziness.  She states the symptoms started this evening. Denies numbness and tingling in her body and denies headache. Family is reporting that her back has been hurting for the past month.

## 2024-01-04 DIAGNOSIS — N390 Urinary tract infection, site not specified: Secondary | ICD-10-CM | POA: Diagnosis not present

## 2024-01-04 LAB — COMPREHENSIVE METABOLIC PANEL WITH GFR
ALT: 30 U/L (ref 0–44)
AST: 44 U/L — ABNORMAL HIGH (ref 15–41)
Albumin: 3.7 g/dL (ref 3.5–5.0)
Alkaline Phosphatase: 49 U/L (ref 38–126)
Anion gap: 13 (ref 5–15)
BUN: 15 mg/dL (ref 8–23)
CO2: 24 mmol/L (ref 22–32)
Calcium: 9.4 mg/dL (ref 8.9–10.3)
Chloride: 98 mmol/L (ref 98–111)
Creatinine, Ser: 0.9 mg/dL (ref 0.44–1.00)
GFR, Estimated: >60 mL/min (ref >60)
Glucose, Bld: 130 mg/dL — ABNORMAL HIGH (ref 70–99)
Potassium: 4.6 mmol/L (ref 3.5–5.1)
Sodium: 135 mmol/L (ref 135–145)
Total Bilirubin: 0.5 mg/dL (ref 0.0–1.2)
Total Protein: 6.6 g/dL (ref 6.5–8.1)

## 2024-01-04 LAB — LIPASE, BLOOD: Lipase: 49 U/L (ref 11–51)

## 2024-01-04 MED ORDER — ONDANSETRON 4 MG PO TBDP: 4.0000 mg | ORAL_TABLET | Freq: Three times a day (TID) | ORAL | 0 refills | Status: AC | PRN

## 2024-01-04 MED ORDER — CEPHALEXIN 500 MG PO CAPS: 500.0000 mg | ORAL_CAPSULE | Freq: Two times a day (BID) | ORAL | 0 refills | Status: AC

## 2024-01-04 MED ORDER — CEPHALEXIN 250 MG PO CAPS
500.0000 mg | ORAL_CAPSULE | Freq: Once | ORAL | Status: AC
  Administered 2024-01-04: 500 mg via ORAL
  Filled 2024-01-04: qty 2

## 2024-01-04 NOTE — Discharge Instructions (Signed)
 Take Keflex  as prescribed until finished. Use Zofran  for management of nausea/vomiting. Drink plenty of water to prevent dehydration. Follow up with your primary care doctor to ensure clearance/resolution of infection.

## 2024-01-04 NOTE — ED Provider Notes (Signed)
 [place] EMERGENCY DEPARTMENT AT [facility] Provider Note   CSN: [id number] Arrival date & time: 01/03/24  2151     Patient presents with: No chief complaint on file.   Heather Mills is a 73 y.o. female.   73 year old female with a history of dyslipidemia, rheumatoid arthritis (on methotrexate weekly) presents to the emergency department for evaluation.  She was at home this evening when she developed a sense of dizziness with nausea.  She had an episode of vomiting.  She does note some low back pain, but states this is chronic for her.  She has not noted any specific worsening of her back pain.  No recent trauma.  Has been experiencing some increased urinary frequency over the past few days.  Denies dysuria.  No fevers, sick contacts, extremity numbness or paresthesias, extremity weakness, headache.  She checked her blood pressure at home which was elevated, around 150 systolic.  This has been improving spontaneously since arrival to the ED.  The history is provided by the patient and a relative. A language interpreter was used (translation services offered; patient opts for use of daughter at bedside for translation).       Prior to Admission medications   Medication Sig Start Date End Date Taking? Authorizing Provider  cephALEXin  (KEFLEX ) 500 MG capsule Take 1 capsule (500 mg total) by mouth 2 (two) times daily. 01/04/24  Yes Keith Sor, PA-C  ondansetron  (ZOFRAN -ODT) 4 MG disintegrating tablet Take 1 tablet (4 mg total) by mouth every 8 (eight) hours as needed for nausea or vomiting. 01/04/24  Yes Keith Sor, PA-C  Ascorbic Acid (VITAMIN C) 1000 MG tablet Take 1,000 mg by mouth daily.    [provider]  aspirin EC 81 MG tablet Take 81 mg by mouth daily. Swallow whole.    [provider]  Calcium  Carb-Cholecalciferol (CALCIUM  + VITAMIN D3 PO) Take 1 tablet by mouth daily.    [provider]  ENBREL SURECLICK 50 MG/ML injection Inject 50 mg into  the skin once a week.    [provider]  ezetimibe  (ZETIA ) 10 MG tablet Take 1 tablet by mouth once daily 10/09/23   Lavona Agent, MD  ferrous sulfate 325 (65 FE) MG tablet Take 325 mg by mouth once a week.    [provider]  FLUoxetine  (PROZAC ) 20 MG capsule Take 20 mg by mouth daily.    [provider]  folic acid (FOLVITE) 1 MG tablet Take 1 mg by mouth daily.    [provider]  Glucosamine HCl 1500 MG TABS Take 1,500 mg by mouth daily.    [provider]  ibuprofen  (ADVIL ) 800 MG tablet Take 1 tablet (800 mg total) by mouth 3 (three) times daily. 09/09/20   O'NealDarryle Ned, MD  Magnesium 250 MG TABS Take 250 mg by mouth daily.    [provider]  methotrexate (RHEUMATREX) 2.5 MG tablet Take 15 mg by mouth every Wednesday. 02/10/23   [provider]  Multiple Vitamin (MULTIVITAMIN) capsule Take 1 capsule by mouth daily.    [provider]  Omega-3 Fatty Acids (FISH OIL) 1200 MG CAPS Take 1,200 mg by mouth daily.    [provider]  RESVERATROL PO Take 1,450 mg by mouth daily.    [provider]  rosuvastatin  (CRESTOR ) 40 MG tablet Take 1 tablet (40 mg total) by mouth daily. 05/03/23   Lavona Agent, MD  Vitamin A 2400 MCG (8000 UT) CAPS Take 8,000 Units by  mouth 3 (three) times a week.    [provider]  vitamin E 180 MG (400 UNITS) capsule Take 400 Units by mouth 3 (three) times a week.    [provider]  zinc gluconate 50 MG tablet Take 50 mg by mouth once a week.    [provider]    Allergies: Doxycycline , Oxycodone , and Oxycodone -acetaminophen     Review of Systems Ten systems reviewed and are negative for acute change, except as noted in the HPI.    Updated Vital Signs BP 129/67   Pulse (!) 59   Temp 97.9 F (36.6 C)   Resp 16   Ht 5' 2 (1.575 m)   Wt 59 kg   LMP  (LMP Unknown)   SpO2 98%   BMI 23.79 kg/m   Physical Exam Vitals and nursing  note reviewed.  Constitutional:      General: She is not in acute distress.    Appearance: She is well-developed. She is not diaphoretic.     Comments: Nontoxic appearing, pleasant elderly female.  HENT:     Head: Normocephalic and atraumatic.  Eyes:     General: No scleral icterus.    Conjunctiva/sclera: Conjunctivae normal.  Cardiovascular:     Rate and Rhythm: Normal rate and regular rhythm.     Pulses: Normal pulses.  Pulmonary:     Effort: Pulmonary effort is normal. No respiratory distress.     Comments: Respirations even and unlabored Abdominal:     Comments: Abdomen soft, nontender.  Musculoskeletal:        General: Normal range of motion.     Cervical back: Normal range of motion.  Skin:    General: Skin is warm and dry.     Coloration: Skin is not pale.     Findings: No erythema or rash.  Neurological:     Mental Status: She is alert and oriented to person, place, and time.     Coordination: Coordination normal.  Psychiatric:        Behavior: Behavior normal.     (all labs ordered are listed, but only abnormal results are displayed) Labs Reviewed  URINALYSIS, ROUTINE W REFLEX MICROSCOPIC - Abnormal; Notable for the following components:      Result Value   Leukocytes,Ua MODERATE (*)    Bacteria, UA RARE (*)    All other components within normal limits  CBC - Abnormal; Notable for the following components:   RBC 3.80 (*)    MCV 100.3 (*)    All other components within normal limits  COMPREHENSIVE METABOLIC PANEL WITH GFR - Abnormal; Notable for the following components:   Glucose, Bld 130 (*)    AST 44 (*)    All other components within normal limits  URINE CULTURE  LIPASE, BLOOD    EKG: None  Radiology: No results found.   Procedures   Medications Ordered in the ED  ondansetron  (ZOFRAN -ODT) disintegrating tablet 4 mg (4 mg Oral Given 01/03/24 2212)  cephALEXin  (KEFLEX ) capsule 500 mg (500 mg Oral Given 01/04/24 0215)                                     Medical Decision Making Amount and/or Complexity of Data Reviewed Labs: ordered.  Risk Prescription drug management.   This patient presents to the ED for concern of N/V and dizziness, this involves an extensive number of treatment options, and is a complaint  that carries with it a high risk of complications and morbidity.  The differential diagnosis includes BPPV vs central vertigo vs viral illness vs UTI vs dehydration   Co morbidities that complicate the patient evaluation  Dyslipidemia RA   Additional history obtained:  Additional history obtained from daughter, at bedside External records from outside source obtained and reviewed including reassuring PET scan 07/30/23   Lab Tests:  I Ordered, and personally interpreted labs.  The pertinent results include:  UA w/21-50 WBCs. CBC, CMP, Lipase reassuring; WNL.   Cardiac Monitoring:  The patient was maintained on a cardiac monitor.  I personally viewed and interpreted the cardiac monitored which showed an underlying rhythm of: NSR   Medicines ordered and prescription drug management:  I ordered medication including Keflex  for UTI, Zofran  for nausea  Reevaluation of the patient after these medicines showed that the patient improved I have reviewed the patients home medicines and have made adjustments as needed   Test Considered:  CT renal   Problem List / ED Course:  Patient presenting for an episode of dizziness with associated nausea, vomiting.  Her nausea has improved since receiving Zofran  in triage. Given her age, associated urinary frequency, pyuria on urinalysis, will start on antibiotics for presumed urinary tract infection.  This could certainly be contributed to nausea episode and transient dizziness. The patient has no associated headache, extremity numbness or paresthesias, she has no gross focal abnormality on bedside assessment.  Her subjective dizziness has not persisted.  Do not presently have  clinical concern for central vertigo. Vital signs have remained reassuring.  She has no leukocytosis.  No current concern for SIRS/sepsis. Initial dose of antibiotics given in the ED prior to discharge.  Tolerating PO without issue or recurrent emesis.   Reevaluation:  After the interventions noted above, I reevaluated the patient and found that they have :improved   Social Determinants of Health:  Language barrier   Dispostion:  After consideration of the diagnostic results and the patients response to treatment, I feel that the patent would benefit from outpatient course of abx. Recommended PCP f/u for reassessment. Return precautions discussed and provided. Patient discharged in stable condition with no unaddressed concerns.       Final diagnoses:  Pyuria due to bacterial urinary tract infection    ED Discharge Orders          Ordered    cephALEXin  (KEFLEX ) 500 MG capsule  2 times daily        01/04/24 0221    ondansetron  (ZOFRAN -ODT) 4 MG disintegrating tablet  Every 8 hours PRN        01/04/24 0221               Keith Sor, PA-C 01/04/24 0240    Bari Charmaine FALCON, MD 01/05/24 [phone]
